# Patient Record
Sex: Male | Born: 1950
Health system: Southern US, Community
[De-identification: ages and names within clinical notes are randomized; demographics above are authoritative.]

## PROBLEM LIST (undated history)

## (undated) ENCOUNTER — Emergency Department (HOSPITAL_COMMUNITY): Admission: EM | Discharge status: Against Medical Advice | Payer: Self-pay

## (undated) DIAGNOSIS — M545 Low back pain, unspecified: Secondary | ICD-10-CM

## (undated) DIAGNOSIS — Z720 Tobacco use: Secondary | ICD-10-CM

## (undated) DIAGNOSIS — I255 Ischemic cardiomyopathy: Secondary | ICD-10-CM

## (undated) DIAGNOSIS — I10 Essential (primary) hypertension: Secondary | ICD-10-CM

## (undated) DIAGNOSIS — G8929 Other chronic pain: Secondary | ICD-10-CM

## (undated) DIAGNOSIS — I4891 Unspecified atrial fibrillation: Secondary | ICD-10-CM

## (undated) DIAGNOSIS — I5022 Chronic systolic (congestive) heart failure: Secondary | ICD-10-CM

## (undated) DIAGNOSIS — E785 Hyperlipidemia, unspecified: Secondary | ICD-10-CM

## (undated) DIAGNOSIS — F1010 Alcohol abuse, uncomplicated: Secondary | ICD-10-CM

## (undated) DIAGNOSIS — I2510 Atherosclerotic heart disease of native coronary artery without angina pectoris: Secondary | ICD-10-CM

## (undated) HISTORY — DX: Ischemic cardiomyopathy: I25.5

## (undated) HISTORY — DX: Hyperlipidemia, unspecified: E78.5

## (undated) HISTORY — DX: Alcohol abuse, uncomplicated: F10.10

## (undated) HISTORY — DX: Atherosclerotic heart disease of native coronary artery without angina pectoris: I25.10

## (undated) HISTORY — DX: Essential (primary) hypertension: I10

## (undated) HISTORY — DX: Unspecified atrial fibrillation: I48.91

## (undated) HISTORY — DX: Tobacco use: Z72.0

## (undated) HISTORY — PX: BACK SURGERY: SHX140

## (undated) HISTORY — DX: Chronic systolic (congestive) heart failure: I50.22

---

## 2011-02-27 ENCOUNTER — Other Ambulatory Visit: Payer: Self-pay

## 2011-02-27 ENCOUNTER — Emergency Department (HOSPITAL_COMMUNITY): Payer: Medicaid Other

## 2011-02-27 ENCOUNTER — Emergency Department (HOSPITAL_COMMUNITY)
Admission: EM | Admit: 2011-02-27 | Discharge: 2011-02-27 | Disposition: A | Discharge status: Other Acute Inpatient Hospital | Payer: Medicaid Other | Source: Home / Self Care | Attending: Emergency Medicine | Admitting: Emergency Medicine

## 2011-02-27 ENCOUNTER — Inpatient Hospital Stay (HOSPITAL_COMMUNITY)
Admission: EM | Admit: 2011-02-27 | Discharge: 2011-03-05 | DRG: 246 | Disposition: A | Discharge status: Home / Self Care | Payer: Medicaid Other | Source: Ambulatory Visit | Attending: Cardiovascular Disease | Admitting: Cardiovascular Disease

## 2011-02-27 ENCOUNTER — Encounter: Payer: Self-pay | Admitting: *Deleted

## 2011-02-27 DIAGNOSIS — F1020 Alcohol dependence, uncomplicated: Secondary | ICD-10-CM | POA: Diagnosis present

## 2011-02-27 DIAGNOSIS — F17200 Nicotine dependence, unspecified, uncomplicated: Secondary | ICD-10-CM | POA: Insufficient documentation

## 2011-02-27 DIAGNOSIS — I428 Other cardiomyopathies: Secondary | ICD-10-CM | POA: Diagnosis present

## 2011-02-27 DIAGNOSIS — I2119 ST elevation (STEMI) myocardial infarction involving other coronary artery of inferior wall: Secondary | ICD-10-CM | POA: Diagnosis present

## 2011-02-27 DIAGNOSIS — Z720 Tobacco use: Secondary | ICD-10-CM

## 2011-02-27 DIAGNOSIS — F10939 Alcohol use, unspecified with withdrawal, unspecified: Secondary | ICD-10-CM

## 2011-02-27 DIAGNOSIS — E871 Hypo-osmolality and hyponatremia: Secondary | ICD-10-CM | POA: Diagnosis not present

## 2011-02-27 DIAGNOSIS — I2510 Atherosclerotic heart disease of native coronary artery without angina pectoris: Secondary | ICD-10-CM

## 2011-02-27 DIAGNOSIS — I255 Ischemic cardiomyopathy: Secondary | ICD-10-CM

## 2011-02-27 DIAGNOSIS — I059 Rheumatic mitral valve disease, unspecified: Secondary | ICD-10-CM

## 2011-02-27 DIAGNOSIS — I4729 Other ventricular tachycardia: Secondary | ICD-10-CM | POA: Diagnosis not present

## 2011-02-27 DIAGNOSIS — R079 Chest pain, unspecified: Secondary | ICD-10-CM

## 2011-02-27 DIAGNOSIS — I4891 Unspecified atrial fibrillation: Secondary | ICD-10-CM | POA: Diagnosis present

## 2011-02-27 DIAGNOSIS — I5021 Acute systolic (congestive) heart failure: Secondary | ICD-10-CM | POA: Diagnosis not present

## 2011-02-27 DIAGNOSIS — I472 Ventricular tachycardia, unspecified: Secondary | ICD-10-CM | POA: Diagnosis not present

## 2011-02-27 DIAGNOSIS — K5900 Constipation, unspecified: Secondary | ICD-10-CM | POA: Diagnosis not present

## 2011-02-27 DIAGNOSIS — I2589 Other forms of chronic ischemic heart disease: Secondary | ICD-10-CM | POA: Diagnosis present

## 2011-02-27 DIAGNOSIS — F10239 Alcohol dependence with withdrawal, unspecified: Secondary | ICD-10-CM | POA: Diagnosis not present

## 2011-02-27 DIAGNOSIS — F1010 Alcohol abuse, uncomplicated: Secondary | ICD-10-CM | POA: Diagnosis present

## 2011-02-27 HISTORY — PX: CORONARY ANGIOPLASTY WITH STENT PLACEMENT: SHX49

## 2011-02-27 LAB — CBC
HCT: 50.7 % (ref 39.0–52.0)
HCT: 43.9 % (ref 39.0–52.0)
Hemoglobin: 17.3 g/dL — ABNORMAL HIGH (ref 13.0–17.0)
MCHC: 34.1 g/dL (ref 30.0–36.0)
MCHC: 33.9 g/dL (ref 30.0–36.0)
MCV: 95.2 fL (ref 78.0–100.0)
RDW: 14 % (ref 11.5–15.5)

## 2011-02-27 LAB — COMPREHENSIVE METABOLIC PANEL
Albumin: 3.2 g/dL — ABNORMAL LOW (ref 3.5–5.2)
BUN: 16 mg/dL (ref 6–23)
Creatinine, Ser: 0.87 mg/dL (ref 0.50–1.35)
Total Bilirubin: 0.7 mg/dL (ref 0.3–1.2)
Total Protein: 5.6 g/dL — ABNORMAL LOW (ref 6.0–8.3)

## 2011-02-27 LAB — BASIC METABOLIC PANEL
BUN: 15 mg/dL (ref 6–23)
CO2: 22 mEq/L (ref 19–32)
Chloride: 102 mEq/L (ref 96–112)
Creatinine, Ser: 1.13 mg/dL (ref 0.50–1.35)

## 2011-02-27 LAB — LIPID PANEL
HDL: 38 mg/dL — ABNORMAL LOW (ref >39)
Triglycerides: 71 mg/dL (ref <150)

## 2011-02-27 LAB — CARDIAC PANEL(CRET KIN+CKTOT+MB+TROPI)
CK, MB: 22 ng/mL (ref 0.3–4.0)
CK, MB: 408.4 ng/mL (ref 0.3–4.0)
Relative Index: 17.5 — ABNORMAL HIGH (ref 0.0–2.5)
Total CK: 207 U/L (ref 7–232)
Total CK: 2335 U/L — ABNORMAL HIGH (ref 7–232)
Troponin I: >25 ng/mL (ref <0.30)

## 2011-02-27 LAB — DIFFERENTIAL
Basophils Relative: 1 % (ref 0–1)
Lymphocytes Relative: 21 % (ref 12–46)
Monocytes Absolute: 0.7 10*3/uL (ref 0.1–1.0)
Monocytes Relative: 8 % (ref 3–12)
Neutro Abs: 6.4 10*3/uL (ref 1.7–7.7)

## 2011-02-27 LAB — MRSA PCR SCREENING: MRSA by PCR: NEGATIVE

## 2011-02-27 LAB — APTT: aPTT: 197 seconds — ABNORMAL HIGH (ref 24–37)

## 2011-02-27 LAB — POCT I-STAT, CHEM 8
Glucose, Bld: 91 mg/dL (ref 70–99)
HCT: 49 % (ref 39.0–52.0)
Hemoglobin: 16.7 g/dL (ref 13.0–17.0)
Potassium: 3.3 mEq/L — ABNORMAL LOW (ref 3.5–5.1)
TCO2: 12 mmol/L (ref 0–100)

## 2011-02-27 LAB — HEMOGLOBIN A1C
Hgb A1c MFr Bld: 5.5 % (ref <5.7)
Mean Plasma Glucose: 111 mg/dL (ref <117)

## 2011-02-27 LAB — PROTIME-INR: Prothrombin Time: 36.6 seconds — ABNORMAL HIGH (ref 11.6–15.2)

## 2011-02-27 LAB — POCT I-STAT TROPONIN I: Troponin i, poc: 0.08 ng/mL (ref 0.00–0.08)

## 2011-02-27 MED ORDER — HEPARIN (PORCINE) IN NACL 100-0.45 UNIT/ML-% IJ SOLN
1000.0000 [IU]/h | Freq: Once | INTRAMUSCULAR | Status: AC
  Administered 2011-02-27: 1000 [IU]/h via INTRAVENOUS

## 2011-02-27 MED ORDER — MORPHINE SULFATE 4 MG/ML IJ SOLN
INTRAMUSCULAR | Status: AC
  Filled 2011-02-27: qty 1

## 2011-02-27 MED ORDER — HEPARIN (PORCINE) IN NACL 100-0.45 UNIT/ML-% IJ SOLN
INTRAMUSCULAR | Status: AC
  Filled 2011-02-27: qty 250

## 2011-02-27 MED ORDER — NITROGLYCERIN IN D5W 200-5 MCG/ML-% IV SOLN
5.0000 ug/min | Freq: Once | INTRAVENOUS | Status: AC
  Administered 2011-02-27: 5 ug/min via INTRAVENOUS

## 2011-02-27 MED ORDER — METOPROLOL TARTRATE 1 MG/ML IV SOLN
5.0000 mg | Freq: Once | INTRAVENOUS | Status: AC
  Administered 2011-02-27: 5 mg via INTRAVENOUS
  Filled 2011-02-27: qty 5

## 2011-02-27 MED ORDER — NITROGLYCERIN IN D5W 200-5 MCG/ML-% IV SOLN
INTRAVENOUS | Status: AC
  Filled 2011-02-27: qty 250

## 2011-02-27 MED ORDER — ASPIRIN 81 MG PO CHEW
324.0000 mg | CHEWABLE_TABLET | Freq: Once | ORAL | Status: AC
  Administered 2011-02-27: 324 mg via ORAL

## 2011-02-27 MED ORDER — ASPIRIN 81 MG PO CHEW
CHEWABLE_TABLET | ORAL | Status: AC
  Administered 2011-02-27: 324 mg via ORAL
  Filled 2011-02-27: qty 4

## 2011-02-27 MED ORDER — MORPHINE SULFATE 4 MG/ML IJ SOLN
4.0000 mg | Freq: Once | INTRAMUSCULAR | Status: AC
  Administered 2011-02-27: 4 mg via INTRAVENOUS

## 2011-02-27 MED ORDER — HEPARIN BOLUS VIA INFUSION
4000.0000 [IU] | Freq: Once | INTRAVENOUS | Status: AC
  Administered 2011-02-27: 4000 [IU] via INTRAVENOUS
  Filled 2011-02-27: qty 4000

## 2011-02-27 NOTE — ED Notes (Signed)
[facility] called for CODE STEMI.  [facility] also called for transport.

## 2011-02-27 NOTE — ED Notes (Signed)
Spoke with cath lab - states Cardiologist is there and is requesting faxed copy of EKG.  Faxed by Diplomatic Services operational officer.

## 2011-02-27 NOTE — H&P (Signed)
NAME:  Andres Ward, PRETTY.:  [id number]  MEDICAL RECORD NO.:  [id number]  LOCATION:  2903                         FACILITY:  [facility]  PHYSICIAN:  Verne Carrow, MDDATE OF BIRTH:  1951-02-04  DATE OF ADMISSION:  02/27/2011 DATE OF DISCHARGE:                             HISTORY & PHYSICAL   CHIEF COMPLAINT:  Chest pain.  HISTORY OF PRESENT ILLNESS:  The patient is a 60 year old male with past medical history of tobacco abuse, who presented to the [facility] for evaluation of acute, sudden onset substernal chest pain with radiation to his back and in between his shoulder blades that began at approximately 8:15 a.m. on the morning of admission, when he was walking through the woods.  The pain was described as a burning and pressure-type pain that remained constant since onset with associated diaphoresis, shortness of breath, nausea.  The patient denied any specific aggravating factors; however, he did notably indicates mild improvement of pain with rest.  He notes that his chest pain was preceded by shortness of breath that awoke him from sleep at approximately 1:30 a.m. on the morning of admission.  Otherwise, the patient denied vomiting, recent cough, apparent chest pain, fevers, chills.  He denies known history of early myocardial infarction in family members.  In the [facility] ER, the patient was found to have an inferior wall ST-elevation MI.  Therefore, the patient was administered aspirin, morphine, oxygen supplementation, nitroglycerin with mild improvement of symptoms after nitroglycerin administration.  He was also loaded with heparin.  The patient was subsequently transferred to [facility] for emergent PCI via code STEMI protocol.  The patient continued to complain of substernal chest pain on presentation to [facility].  OUTPATIENT MEDICATIONS:  None.  ALLERGIES:  No known drug allergies.  PAST MEDICAL HISTORY:   Tobacco abuse, alcohol abuse, the patient has notably not seen a physician since approximately 1986.  PAST SURGICAL HISTORY:  Back surgery.  SOCIAL HISTORY:  One pack per day, tobacco abuse since teenage years (over 40-pack year smoking history), 3 shots of vodka daily since teenage years, denies drug abuse.  FAMILY HISTORY:  Father had 5 strokes, MI in his 41s.  Mother had an MI at age 1, hypertension.  REVIEW OF SYSTEMS:  As per HPI.  PHYSICAL EXAMINATION:  VITAL SIGNS:  Pulse 85, respirations 20, blood pressure 150/118, and O2 saturation 98% on 2 L nasal cannula. GENERAL:  No acute distress.  Alert and cooperative throughout exam, mildly diaphoretic. CVS:  Irregularly irregular rhythm, distant heart sounds. PULMONARY:  Clear to auscultation bilaterally, no wheezes, rales, or rhonchi. ABDOMEN:  Normoactive bowel sounds, soft, nontender, nondistended. EXTREMITIES:  Nonedematous, 2+ DP pulses bilaterally.  LABORATORY DATA:  Labs on admission:  At [facility] BMET:  Sodium 137, potassium 4, chloride 102, bicarb 22, BUN 15, creatinine 1.13, glucose 147. CBC:  WBC 9.2, hemoglobin 17.3, hematocrit 50.7, and platelets 239.  At ER, I-STAT labs as follows:  Sodium 105, potassium 3.3, chloride 75, bicarb 12, BUN 17, creatinine 0.4, glucose 91, hemoglobin 16.7, hematocrit 49. Imaging:  None.  EKG:  ST elevation in lead II, III, aVF.  ST depression in I, aVL, and V6.  Irregularly irregular rate and rhythm.  ASSESSMENT AND PLAN:  The patient is a 60 year old male with a past medical history of tobacco and alcohol abuse, who presented to [facility] with typical chest pain symptoms starting at 18:15 on the morning of admission, he was found to have an inferior wall STEMI and subsequently transferred to [facility] for emergent catheterization. 1. Inferior wall STEMI - the patient was loaded with heparin at [facility], treated with aspirin, morphine, and  oxygen     supplementation.  He will undergo emergent left heart     catheterization per Dr. Verne Carrow with post     catheterizations orders to follow.  We will pursue further risk     factor stratification and encourage smoking cessation. 2. Substance abuse, tobacco abuse - 1 pack per day for over 40 years -     we will recommend smoking cessation counseling, would consider     nicotine patch after acute arrhythmias have stabilized. 3. Alcohol abuse - 3 shots of vodka per day for over 40 years.  We     will request alcohol cessation counseling per Social Work.  Post catheterization, the patient will be transferred to the Cardiac ICU with continued management per [facility].    ______________________________ Johnette Abraham, DO   ______________________________ Verne Carrow, MD    SK/MEDQ  D:  02/27/2011  T:  02/27/2011  Job:  [id number]  cc:   Verne Carrow, MD  Electronically Signed by Johnette Abraham DO on 02/27/2011 03:00:01 PM Electronically Signed by Verne Carrow MD on 02/27/2011 03:25:32 PM

## 2011-02-27 NOTE — ED Notes (Signed)
Pt c/o cp. Pt states his chest began hurting this am at aprox 0100. Pt states pain radiates to left arm and jaw. Pt also c/o sob.

## 2011-02-27 NOTE — ED Provider Notes (Signed)
History   This chart was scribed for Donnetta Hutching, MD by Clarita Crane. The patient was seen in room APA02/APA02 and the patient's care was started at 9:46AM.   CSN: [id number] Arrival date & time: 02/27/2011  9:41 AM   First MD Initiated Contact with Patient 02/27/11 0944      Chief Complaint  Patient presents with  . Chest Pain   HPI level V caveat for urgent need for intervention and severe illness Daeshaun Specht is a 60 y.o. male who presents to the Emergency Department complaining of constant severe, non-radiating chest pain described as burning which radiates through to back between shoulders onset 8.5 hours ago which awoke him from his sleep and persistent since with associated SOB, diaphoresis and nausea. Patient states he was not administered aspirin prior to arrival. Patient is a current smoker and denies family h/o pre-mature cardiac related death. Reports he has not been evaluated by a physician in 30 years.  History reviewed. No pertinent past medical history.  Past Surgical History  Procedure Date  . Back surgery     History reviewed. No pertinent family history.  History  Substance Use Topics  . Smoking status: Current Everyday Smoker -- 1.0 packs/day  . Smokeless tobacco: Not on file  . Alcohol Use: Yes     daily      Review of Systems  Unable to perform ROS: Other   10 Systems reviewed and are negative for acute change except as noted in the HPI.  Allergies  Review of patient's allergies indicates no known allergies.  Home Medications  No current outpatient prescriptions on file.  There were no vitals taken for this visit.  Physical Exam  Nursing note and vitals reviewed. Constitutional: He is oriented to person, place, and time. He appears well-developed and well-nourished.  HENT:  Head: Normocephalic and atraumatic.  Eyes: EOM are normal. Pupils are equal, round, and reactive to light.  Neck: Neck supple. No tracheal deviation present.    Cardiovascular: Normal rate.  An irregularly irregular rhythm present.  Pulmonary/Chest: Effort normal. No respiratory distress.  Abdominal: He exhibits no distension.  Musculoskeletal: Normal range of motion. He exhibits no edema.  Neurological: He is alert and oriented to person, place, and time. No sensory deficit.  Skin: Skin is warm and dry.  Psychiatric: He has a normal mood and affect. His behavior is normal.    ED Course  Procedures (including critical care time)  DIAGNOSTIC STUDIES: Oxygen Saturation is 98% on nasal canula-2L, normal by my interpretation.    COORDINATION OF CARE: 9:48AM- Code STEMI initiated. Morphine, aspirin, nitroglycerin, heparin and a beta blocker were ordered in preparation for transfer to [facility].  9:53AM- Patient's friend informed patient is currently being treated for acute MI and will be transferred to [facility] for further treatment.    Labs Reviewed  CBC  DIFFERENTIAL  BASIC METABOLIC PANEL  I-STAT TROPONIN I   No results found.   No diagnosis found.  Date: 02/27/2011  Rate: 118  Rhythm: atrial fibrillation  QRS Axis: normal  Intervals: normal  ST/T Wave abnormalities: ST elevations inferiorly  Conduction Disutrbances:none  Narrative Interpretation:   Old EKG Reviewed: none available  ST depression laterally  MDM  History, physical exam, EKG consistent with inferior MI. IV morphine, beta blocker, aspirin, nitroglycerin, heparin given. Code STEMI called immediately. Cardiologist has not called me back. Patient will be sent to [facility] to catheter lab. I made a decision to transport was of utmost importance  cardiologist Nanetta Batty has not returned call. Patient needs to be moved to higher level care.  CRITICAL CARE Performed by: Donnetta Hutching   Total critical care time: 45  Critical care time was exclusive of separately billable procedures and treating other patients.  Critical care was necessary to treat or  prevent imminent or life-threatening deterioration.  Critical care was time spent personally by me on the following activities: development of treatment plan with patient and/or surrogate as well as nursing, discussions with consultants, evaluation of patient's response to treatment, examination of patient, obtaining history from patient or surrogate, ordering and performing treatments and interventions, ordering and review of laboratory studies, ordering and review of radiographic studies, pulse oximetry and re-evaluation of patient's condition.    I personally performed the services described in this documentation, which was scribed in my presence. The recorded information has been reviewed and considered.    Donnetta Hutching, MD 02/27/11 1014

## 2011-02-28 ENCOUNTER — Other Ambulatory Visit: Payer: Self-pay

## 2011-02-28 DIAGNOSIS — I2119 ST elevation (STEMI) myocardial infarction involving other coronary artery of inferior wall: Secondary | ICD-10-CM

## 2011-02-28 LAB — DIFFERENTIAL
Eosinophils Absolute: 0.1 10*3/uL (ref 0.0–0.7)
Eosinophils Relative: 1 % (ref 0–5)
Lymphocytes Relative: 24 % (ref 12–46)
Lymphs Abs: 1.8 10*3/uL (ref 0.7–4.0)
Monocytes Absolute: 0.7 10*3/uL (ref 0.1–1.0)
Monocytes Relative: 9 % (ref 3–12)

## 2011-02-28 LAB — LIPID PANEL
LDL Cholesterol: 108 mg/dL — ABNORMAL HIGH (ref 0–99)
Triglycerides: 105 mg/dL (ref <150)

## 2011-02-28 LAB — CBC
HCT: 42.3 % (ref 39.0–52.0)
MCH: 33.1 pg (ref 26.0–34.0)
MCHC: 34.5 g/dL (ref 30.0–36.0)
MCV: 95.9 fL (ref 78.0–100.0)
Platelets: 179 10*3/uL (ref 150–400)
RDW: 14.1 % (ref 11.5–15.5)

## 2011-02-28 LAB — BASIC METABOLIC PANEL
BUN: 20 mg/dL (ref 6–23)
CO2: 20 mEq/L (ref 19–32)
Calcium: 8.1 mg/dL — ABNORMAL LOW (ref 8.4–10.5)
GFR calc non Af Amer: >90 mL/min (ref >90)
Glucose, Bld: 109 mg/dL — ABNORMAL HIGH (ref 70–99)

## 2011-02-28 LAB — HEMOGLOBIN A1C
Hgb A1c MFr Bld: 5.5 % (ref <5.7)
Mean Plasma Glucose: 111 mg/dL (ref <117)

## 2011-02-28 LAB — CARDIAC PANEL(CRET KIN+CKTOT+MB+TROPI)
Relative Index: 16.1 — ABNORMAL HIGH (ref 0.0–2.5)
Total CK: 2510 U/L — ABNORMAL HIGH (ref 7–232)

## 2011-02-28 LAB — TSH: TSH: 2.315 u[IU]/mL (ref 0.350–4.500)

## 2011-02-28 MED ORDER — METOPROLOL TARTRATE 25 MG PO TABS: 25.0000 mg | ORAL_TABLET | Freq: Three times a day (TID) | ORAL | Status: DC

## 2011-02-28 MED ORDER — LORAZEPAM 1 MG PO TABS
1.0000 mg | ORAL_TABLET | Freq: Four times a day (QID) | ORAL | Status: AC | PRN
  Administered 2011-03-02: 1 mg via ORAL
  Filled 2011-02-28: qty 1

## 2011-02-28 MED ORDER — VITAMIN B-1 100 MG PO TABS
100.0000 mg | ORAL_TABLET | Freq: Every day | ORAL | Status: DC
  Administered 2011-03-01 – 2011-03-05 (×6): 100 mg via ORAL
  Filled 2011-02-28 (×6): qty 1

## 2011-02-28 MED ORDER — OXYCODONE-ACETAMINOPHEN 5-325 MG PO TABS: 1.0000 | ORAL_TABLET | ORAL | Status: DC | PRN

## 2011-02-28 MED ORDER — THERA M PLUS PO TABS
1.0000 | ORAL_TABLET | Freq: Every day | ORAL | Status: DC
  Administered 2011-03-01 – 2011-03-05 (×5): 1 via ORAL
  Filled 2011-02-28 (×6): qty 1

## 2011-02-28 MED ORDER — ROSUVASTATIN CALCIUM 40 MG PO TABS
40.0000 mg | ORAL_TABLET | Freq: Every day | ORAL | Status: DC
  Administered 2011-03-01 – 2011-03-04 (×4): 40 mg via ORAL
  Filled 2011-02-28 (×7): qty 1

## 2011-02-28 MED ORDER — FOLIC ACID 1 MG PO TABS
1.0000 mg | ORAL_TABLET | Freq: Every day | ORAL | Status: DC
  Administered 2011-03-01 – 2011-03-05 (×6): 1 mg via ORAL
  Filled 2011-02-28 (×6): qty 1

## 2011-02-28 MED ORDER — LORAZEPAM 2 MG/ML IJ SOLN: 1.0000 mg | Freq: Four times a day (QID) | INTRAMUSCULAR | Status: AC | PRN

## 2011-02-28 MED ORDER — FUROSEMIDE 10 MG/ML IJ SOLN: 40.0000 mg | Freq: Every day | INTRAMUSCULAR | Status: DC

## 2011-02-28 MED ORDER — RAMIPRIL 2.5 MG PO CAPS
2.5000 mg | ORAL_CAPSULE | Freq: Every day | ORAL | Status: DC
  Filled 2011-02-28 (×2): qty 1

## 2011-02-28 MED ORDER — PRASUGREL HCL 10 MG PO TABS
10.0000 mg | ORAL_TABLET | Freq: Every day | ORAL | Status: DC
  Administered 2011-03-01 – 2011-03-05 (×6): 10 mg via ORAL
  Filled 2011-02-28 (×4): qty 1

## 2011-02-28 MED ORDER — NITROGLYCERIN 0.4 MG SL SUBL
0.4000 mg | SUBLINGUAL_TABLET | SUBLINGUAL | Status: DC | PRN
Start: 2011-02-28 — End: 2011-03-05

## 2011-02-28 MED ORDER — ACETAMINOPHEN 325 MG PO TABS
650.0000 mg | ORAL_TABLET | ORAL | Status: DC | PRN
  Administered 2011-03-04: 650 mg via ORAL

## 2011-02-28 MED ORDER — LORAZEPAM 2 MG/ML IJ SOLN
0.0000 mg | Freq: Two times a day (BID) | INTRAMUSCULAR | Status: AC
  Administered 2011-03-01 – 2011-03-02 (×2): 2 mg via INTRAVENOUS

## 2011-02-28 MED ORDER — DEXTROSE 5 % IV SOLN
0.5000 mg/min | INTRAVENOUS | Status: DC
  Filled 2011-02-28: qty 9

## 2011-02-28 MED ORDER — SODIUM CHLORIDE 0.9 % IJ SOLN
3.0000 mL | Freq: Two times a day (BID) | INTRAMUSCULAR | Status: DC
  Filled 2011-02-28 (×4): qty 3

## 2011-02-28 MED ORDER — MORPHINE SULFATE 2 MG/ML IJ SOLN: 2.0000 mg | INTRAMUSCULAR | Status: DC | PRN

## 2011-02-28 MED ORDER — LORAZEPAM 2 MG/ML IJ SOLN
0.0000 mg | Freq: Four times a day (QID) | INTRAMUSCULAR | Status: AC
  Administered 2011-03-01 (×2): 2 mg via INTRAVENOUS

## 2011-02-28 MED ORDER — ASPIRIN EC 81 MG PO TBEC
81.0000 mg | DELAYED_RELEASE_TABLET | Freq: Every day | ORAL | Status: DC
  Administered 2011-03-01 – 2011-03-05 (×6): 81 mg via ORAL
  Filled 2011-02-28 (×6): qty 1

## 2011-02-28 MED ORDER — BIOTENE DRY MOUTH MT LIQD
15.0000 mL | Freq: Two times a day (BID) | OROMUCOSAL | Status: DC
  Administered 2011-03-01 – 2011-03-05 (×6): 15 mL via OROMUCOSAL

## 2011-03-01 DIAGNOSIS — I255 Ischemic cardiomyopathy: Secondary | ICD-10-CM | POA: Diagnosis present

## 2011-03-01 DIAGNOSIS — I4891 Unspecified atrial fibrillation: Secondary | ICD-10-CM | POA: Diagnosis present

## 2011-03-01 DIAGNOSIS — F10239 Alcohol dependence with withdrawal, unspecified: Secondary | ICD-10-CM | POA: Diagnosis not present

## 2011-03-01 DIAGNOSIS — I2510 Atherosclerotic heart disease of native coronary artery without angina pectoris: Secondary | ICD-10-CM | POA: Diagnosis present

## 2011-03-01 LAB — BASIC METABOLIC PANEL
BUN: 25 mg/dL — ABNORMAL HIGH (ref 6–23)
Chloride: 105 mEq/L (ref 96–112)
Creatinine, Ser: 1.06 mg/dL (ref 0.50–1.35)
GFR calc Af Amer: 88 mL/min — ABNORMAL LOW (ref >90)
GFR calc non Af Amer: 76 mL/min — ABNORMAL LOW (ref >90)
Glucose, Bld: 92 mg/dL (ref 70–99)

## 2011-03-01 MED ORDER — FUROSEMIDE 10 MG/ML IJ SOLN
40.0000 mg | Freq: Every day | INTRAMUSCULAR | Status: DC
  Filled 2011-03-01: qty 4

## 2011-03-01 MED ORDER — ENOXAPARIN SODIUM 40 MG/0.4ML ~~LOC~~ SOLN
40.0000 mg | SUBCUTANEOUS | Status: DC
  Administered 2011-03-01: 40 mg via SUBCUTANEOUS
  Filled 2011-03-01 (×2): qty 0.4

## 2011-03-01 MED ORDER — RAMIPRIL 5 MG PO CAPS
5.0000 mg | ORAL_CAPSULE | Freq: Every day | ORAL | Status: DC
  Administered 2011-03-01 – 2011-03-02 (×2): 5 mg via ORAL
  Filled 2011-03-01 (×2): qty 1

## 2011-03-01 MED ORDER — METOPROLOL TARTRATE 50 MG PO TABS
50.0000 mg | ORAL_TABLET | Freq: Two times a day (BID) | ORAL | Status: DC
  Administered 2011-03-01 (×2): 50 mg via ORAL
  Filled 2011-03-01 (×4): qty 1

## 2011-03-01 MED ORDER — SODIUM CHLORIDE 0.9 % IV SOLN
INTRAVENOUS | Status: DC
  Administered 2011-03-02: 10:00:00 via INTRAVENOUS

## 2011-03-01 MED ORDER — SODIUM CHLORIDE 0.9 % IV SOLN
INTRAVENOUS | Status: DC
  Administered 2011-03-01: 10 mL via INTRAVENOUS

## 2011-03-01 MED ORDER — NITROGLYCERIN IN D5W 200-5 MCG/ML-% IV SOLN: 2.0000 ug/min | INTRAVENOUS | Status: DC

## 2011-03-01 NOTE — Plan of Care (Signed)
Problem: Consults Goal: Tobacco Cessation referral if indicated Outcome: Not Progressing CIWA protocol  Problem: Phase II Progression Outcomes Goal: Wean IV nitroglycerin to PO or topical Outcome: Progressing NTG to be d/c'd today

## 2011-03-01 NOTE — Progress Notes (Signed)
SUBJECTIVE:Pt has no complaints this am. He denies SOB/CP. No events overnight. Remains in atrial fibrillation with heart rates 100-110. Pt is on the CWOL protocol.   Filed Vitals:   03/01/11 0400 03/01/11 0500 03/01/11 0600 03/01/11 0700  BP: 130/83 108/79 110/89 133/117  Pulse:      Height:      Weight:  199 lb 11.8 oz (90.6 kg)    SpO2: 92% 95% 96% 97%    Intake/Output Summary (Last 24 hours) at 03/01/11 0755 Last data filed at 03/01/11 7829  Gross per 24 hour  Intake      0 ml  Output    500 ml  Net   -500 ml    PHYSICAL EXAM General: Well developed, well nourished, in no acute distress Lungs:  Faint crackles at the bases. No wheezes, rhonci.  Heart: Irregular, Soft systolic murmur.  Abdomen: Bowel sounds are positive, abdomen soft and non-tender without masses or                  Hernia's noted. Extremities: No edema. All ext warm to touch.  Neuro: Alert and oriented X 3.   LABS: Basic Metabolic Panel:  Basename 02/28/11 0156 02/27/11 1150  NA 137 138  K 4.7 4.2  CL 106 109  CO2 20 17*  GLUCOSE 109* 141*  BUN 20 16  CREATININE 0.94 0.87  CALCIUM 8.1* 7.5*  MG 1.8 --  PHOS -- --   CBC:  Basename 02/28/11 0156 02/27/11 1150 02/27/11 0956  WBC 7.5 9.6 --  NEUTROABS 5.0 -- 6.4  HGB 14.6 14.9 --  HCT 42.3 43.9 --  MCV 95.9 95.2 --  PLT 179 198 --   Cardiac Enzymes:  Basename 02/28/11 0148 02/27/11 2027 02/27/11 1150  CKTOTAL 2510* 2335* 207  CKMB 403.2* 408.4* 22.0*  CKMBINDEX -- -- --  TROPONINI >25.00* >25.00* 2.31*   Fasting Lipid Panel:  Basename 02/28/11 0156  CHOL 166  HDL 37*  LDLCALC 108*  TRIG 105  CHOLHDL 4.5  LDLDIRECT --     ASSESSMENT AND PLAN:  Active Problems:  1. CAD (coronary artery disease): Stable. He is s/p a drug eluting stent in totally occluded RCA on 02/28/11. He has moderate to severe residual disease in the Circumflex and RCA. Will continue ASA, Effient, statin, beta blocker. Plan staged intervention of the RCA  and Circumflex at a later date. D/C NTG drip.   2. Ischemic cardiomyopathy: Biventricular failure with LVEF of 15%. His LV and RV are dilated. This is likely a mixed cardiomyopathy with history of alcohol abuse and severe CAD. The degree of biventricular failure seems out of proportion to his level of CAD though. Suspect alcohol has played a large part in this. Will increase his Lisinopril and beta blocker. Follow up BMET in the am.   3. Atrial fibrillation: Rate controlled at this time. Amiodarone had been started for VT in the cath lab. Amiodarone has been stopped. Will increase beta blocker. No heparin at this time as patient has been agitated and pulling out IV's. Will start heparin drip when patient is cooperative.   4. Alcohol withdrawal: He is on the withdrawal protocol. Will monitor in ICU today.      ,  11/4/20127:55 AM

## 2011-03-02 LAB — BASIC METABOLIC PANEL
BUN: 22 mg/dL (ref 6–23)
Chloride: 101 mEq/L (ref 96–112)
Creatinine, Ser: 1.09 mg/dL (ref 0.50–1.35)
GFR calc Af Amer: 85 mL/min — ABNORMAL LOW (ref >90)
Glucose, Bld: 96 mg/dL (ref 70–99)

## 2011-03-02 LAB — CBC
HCT: 42.6 % (ref 39.0–52.0)
Hemoglobin: 13.9 g/dL (ref 13.0–17.0)
MCH: 31.5 pg (ref 26.0–34.0)
MCHC: 32.6 g/dL (ref 30.0–36.0)
MCV: 96.6 fL (ref 78.0–100.0)
RDW: 13.8 % (ref 11.5–15.5)

## 2011-03-02 MED ORDER — NICOTINE 14 MG/24HR TD PT24
14.0000 mg | MEDICATED_PATCH | Freq: Every day | TRANSDERMAL | Status: DC
  Administered 2011-03-02: 14 mg via TRANSDERMAL
  Filled 2011-03-02 (×2): qty 1

## 2011-03-02 MED ORDER — HEPARIN (PORCINE) IN NACL 100-0.45 UNIT/ML-% IJ SOLN
1950.0000 [IU]/h | INTRAMUSCULAR | Status: DC
  Administered 2011-03-03: 1650 [IU]/h via INTRAVENOUS
  Filled 2011-03-02: qty 250

## 2011-03-02 MED ORDER — HEPARIN BOLUS VIA INFUSION
2000.0000 [IU] | Freq: Once | INTRAVENOUS | Status: AC
  Administered 2011-03-02: 2000 [IU] via INTRAVENOUS

## 2011-03-02 MED ORDER — FUROSEMIDE 40 MG PO TABS
40.0000 mg | ORAL_TABLET | Freq: Every day | ORAL | Status: DC
  Administered 2011-03-02 – 2011-03-05 (×4): 40 mg via ORAL
  Filled 2011-03-02 (×4): qty 1

## 2011-03-02 MED ORDER — RAMIPRIL 10 MG PO CAPS
10.0000 mg | ORAL_CAPSULE | Freq: Every day | ORAL | Status: DC
  Administered 2011-03-03 – 2011-03-05 (×3): 10 mg via ORAL
  Filled 2011-03-02 (×3): qty 1

## 2011-03-02 MED ORDER — HEPARIN BOLUS VIA INFUSION
2500.0000 [IU] | Freq: Once | INTRAVENOUS | Status: AC
  Administered 2011-03-02: 2500 [IU] via INTRAVENOUS
  Filled 2011-03-02: qty 2500

## 2011-03-02 MED ORDER — HEPARIN (PORCINE) IN NACL 100-0.45 UNIT/ML-% IJ SOLN
1350.0000 [IU]/h | INTRAMUSCULAR | Status: DC
  Administered 2011-03-02: 1350 [IU]/h via INTRAVENOUS
  Filled 2011-03-02 (×2): qty 250

## 2011-03-02 MED ORDER — METOPROLOL TARTRATE 50 MG PO TABS
75.0000 mg | ORAL_TABLET | Freq: Two times a day (BID) | ORAL | Status: DC
  Administered 2011-03-02 (×2): 75 mg via ORAL
  Filled 2011-03-02 (×4): qty 1

## 2011-03-02 MED ORDER — HYDRALAZINE HCL 25 MG PO TABS
25.0000 mg | ORAL_TABLET | Freq: Three times a day (TID) | ORAL | Status: DC
  Administered 2011-03-02 – 2011-03-05 (×9): 25 mg via ORAL
  Filled 2011-03-02 (×12): qty 1

## 2011-03-02 NOTE — Progress Notes (Signed)
Dr. Sanjuana Kava  Notified of pt's c/o shortness of breath.  Condition report given. Pt's bp 138/112.  Continues in atrial fibrillation. O2 placed at 2 L n/c.  Pt appears slightly anxious.  Orders noted - will administer new orders.

## 2011-03-02 NOTE — Progress Notes (Signed)
ANTICOAGULATION CONSULT NOTE - Initial Consult  Pharmacy Consult for Heparin  Indication: chest pain/ACS  No Known Allergies  Patient Measurements: Height: 6' (182.9 cm) Weight: 199 lb 11.8 oz (90.6 kg) (bed) IBW/kg (Calculated) : 77.6      Vital Signs: Temp: 98.3 F (36.8 C) (11/05 0000) Temp src: Oral (11/05 0000) BP: 103/78 mmHg (11/05 0500) Pulse Rate: 100  (11/05 0400)  Labs:  Basename 03/02/11 0533 03/01/11 0650 02/28/11 0156 02/28/11 0148 02/27/11 2027 02/27/11 1150  HGB 13.9 -- 14.6 -- -- --  HCT 42.6 -- 42.3 -- -- 43.9  PLT 161 -- 179 -- -- 198  APTT -- -- -- -- -- 197*  LABPROT -- -- 16.5* -- -- 36.6*  INR -- -- 1.31 -- -- 3.62*  HEPARINUNFRC -- -- -- -- -- --  CREATININE 1.09 1.06 0.94 -- -- --  CKTOTAL -- -- -- 2510* 2335* 207  CKMB -- -- -- 403.2* 408.4* 22.0*  TROPONINI -- -- -- >25.00* >25.00* 2.31*   Estimated Creatinine Clearance: 81.1 ml/min (by C-G formula based on Cr of 1.09).  Medical History: No past medical history on file.  Tobacco and Alcohol use   Medications:  Prescriptions prior to admission  Medication Sig Dispense Refill  . ibuprofen (ADVIL,MOTRIN) 200 MG tablet Take 600 mg by mouth every 6 (six) hours as needed. For pain         Assessment: 60 yo Man admitted 11/2 with at STEMI s/p DES pending staged PCI, no ongoing chest pain but to start heparin pending return to the cath lab.  Goal of Therapy:   Heparin level 0.3-0.7 units/ml   Plan:  2000 units bolus followed by and infusion rate of 1350 units/hr  [employer] 03/02/2011,7:54 AM

## 2011-03-02 NOTE — Cardiovascular Report (Signed)
NAME:  Andres Ward, Andres Ward NO.:  [id number]  MEDICAL RECORD NO.:  [id number]  LOCATION:  APA02                         FACILITY:  [facility]  PHYSICIAN:  Verne Carrow, MDDATE OF BIRTH:  11/01/1950  DATE OF PROCEDURE:  02/27/2011 DATE OF DISCHARGE:  02/27/2011                           CARDIAC CATHETERIZATION   PROCEDURE PERFORMED: 1. Left heart cath. 2. Selective coronary angiography. 3. Left ventricular angiogram. 4. PTCA with placement of drug-eluting stent in the proximal right     coronary artery.  OPERATOR:  Verne Carrow, MD.  INDICATION:  This is a 60 year old Caucasian male with history of tobacco abuse, who presented to [facility] Emergency Department this morning with complaints of shortness of breath and chest pain. Apparently, the patient began having shortness of breath around 1 a.m. When he woke up this morning, he had severe chest pain, presented to the emergency department.  His initial EKG showed ST-segment elevation in the inferior leads consistent with an acute inferior ST elevation myocardial infarction.  A code STEMI was activated.  The patient was transported emergently to [facility] directly to the cath lab for the planned emergent cardiac catheterizations.  DETAILS OF PROCEDURE:  When the patient arrived in the cath lab, he was still having complaints of chest pain and jaw pain.  The patient's blood pressure was approximately 90/50 on arrival to the cath lab.  The patient arrived in the cath lab at 10:44 a.m.  The patient was quickly moved to the cath table in supine position.  The right groin was prepped and draped in sterile fashion.  Emergency consent was obtained.  1% lidocaine was used for local anesthesia.  A 6-French sheath was inserted into the right femoral artery without difficulty.  We initially performed angiography of the left coronary system with a JL-4 catheter. We then selectively engaged the right  coronary artery with a 6-French JR4 guiding catheter.  The patient was given a bolus of Angiomax and drip was started.  The patient was given 60 mg of Effient.  We then used a cougar wire to passed down the right coronary artery.  Some flow was reestablished down the vessel after the wire was passed through the total occlusion.  A 2.5 x 15 mm balloon was used to predilate the lesion.  Excellent flow was reestablished into the vessel.  Next, picture actually showed that there was no flow into the distal vessel. Once again, I took the 2.5 x 15 mm balloon down and inflated this in the area of tightest stenosis.  A 3.0 x 16 mm PROMUS element drug-eluting stent was carefully positioned in the midvessel and deployed without difficulty.  A 3.25 x 12 mm noncompliant balloon was inflated twice inside the stented segment.  The stenosis was taken from 100% down to 0%.  There was excellent flow into the distal vessel.  After the intervention, we did discover the downstream, there is also severe disease that will need to be treated in the future.  During the case, the patient became hypotensive and required dopamine intravenous infusion.  The patient is in atrial fibrillation at time of arrival to [facility] and continued to be in atrial fibrillation during the  case.  The patient began to have multiple runs of a wide complex tachycardia after reperfusion.  He was given 150 mg bolus of amiodarone x2.  We then started an amiodarone drip.  The patient had the dopamine weaned here in the cath lab.  He exited the cath lab with a blood pressure approximately 90/60.  The patient had no complaints of chest pain at the conclusion of the case.  He was taken to the CCU in stable, yet guarded position.  The sheath was left in the right femoral artery at the conclusion of the case.  HEMODYNAMIC FINDINGS:  Central aortic pressure 145/95.  Left ventricular pressure 112/15 over 25.  ANGIOGRAPHIC  FINDINGS: 1. The left main coronary artery had no evidence of disease. 2. The left anterior descending artery was a large vessel that coursed     to the apex and gave off a moderate-sized diagonal branch.  The     ostium of this branch had approximately 50% stenosis.  The LAD     itself had mild plaque disease throughout its mid and distal     portion, but no focally obstructive lesions. 3. Circumflex artery is a large caliber vessel that has a 70% proximal     stenosis and then gives off a large bifurcating obtuse marginal     branch which has a 40% stenosis.  The mid vessel just after the     takeoff of the first obtuse marginal branch has a 80% stenosis.     The vessel then gives off a second obtuse marginal branch which has     a 40% ostial stenosis.  The distal vessel just beyond the takeoff     of the second obtuse marginal branch has 99% stenosis and there is     approximately a 2 mm vessel in this location. 4. The right coronary artery had a 100% proximal occlusion. 5. Left ventricular angiogram was performed in the RAO projection with     a hand injection only given the patient's elevated diastolic     filling pressures.  Ejection fraction was approximately 35%.IMPRESSION: 1. Acute inferior ST elevation myocardial infarction secondary to     occluded proximal right coronary artery.  The patient is now status     post drug-eluting stent x1 in the mid right coronary artery. 2. Moderate to severe residual disease in the right coronary artery     and circumflex artery. 3. Moderate left ventricular systolic dysfunction.  RECOMMENDATIONS:  The patient will be watched closely in the CCU.  We will continue aspirin, Effient, and a statin.  We will not give a beta- blocker today because of his hypotension.  We will check an echocardiogram later today.  We will continue the infusion of amiodarone given the patient's multiple runs of wide complex tachycardia consistent with ventricular  tachycardia.  The patient will be started on an ACE inhibitor at a later time as he tolerates.     Verne Carrow, MD     CM/MEDQ  D:  02/27/2011  T:  02/28/2011  Job:  [id number]  Electronically Signed by Verne Carrow MD on 03/02/2011 09:43:54 AM

## 2011-03-02 NOTE — Progress Notes (Signed)
Care Manager, Tomi Bamberger, RN,BSN CM spoke to pt and he does not have any insurance. CM called [facility] and left vm for assistance with hospital bill and questionable disability vs medicaid. When pt is d/c please write all generic medications due to cost and a script for 30 day free effient no refills and original script with refills. CM will provide pt with an effient card. [employer] forms need to be completed for patient assistance-will be in the rm with pt. CM did make pt an appointment at the [facility]. Health dept. 03-17-11 @ 1315. Pt will speak to Meriam Sprague at [facility] to discuss getting help with medications. CM will continue to f/u for additional assistance.

## 2011-03-02 NOTE — Progress Notes (Signed)
Csw department (13:45pm) CSW was unable to do assessment today. Patient was asleep and RN requested CSW to come again tomorrow due to the patients blood pressure being too high.CSW will return tomorrow to complete assessment.  [phone]

## 2011-03-02 NOTE — Progress Notes (Signed)
CARDIAC REHAB PHASE I   PRE:  Rate/Rhythm: 106 afic BP:  Supine:   Sitting: L 145/114 R 147/102  Standing:    SaO2:   MODE:  Ambulation: 430 ft   POST:  Rate/Rhythem: 135 afib  BP:  Supine:   Sitting: 144/101  Standing:    SaO2:    Pt ambulated unit with minimal x1 assist slight unsteady/wobbly. Denies cp or sob. Tolerated well. Pt to chair with feet up. Pt education started for tobacco and alcohol - pt motivated to quit smoking, reluctant to quit drinking. Will follow up again.    Rosalie Doctor

## 2011-03-02 NOTE — Progress Notes (Signed)
CRITICAL VALUE ALERT  Critical value received: co2   Date of notification:  03/02/2011  Time of notification:  0650  Critical value read back:yes  Nurse who received alert:  Gaspar Garbe RN  MD notified (1st page):  Dr. Saralyn Pilar  Time of first page:  0650  MD notified (2nd page):  Time of second page:  Responding MD:  Saralyn Pilar  Time MD responded:  [phone]

## 2011-03-02 NOTE — Progress Notes (Signed)
[place] CARDIOLOGY - PGY II RESIDENT NOTE   Subjective:    No events overnight. Patient feeling well this morning without symptoms of shortness of breath, chest pain, palpitations. States that he feels a lot better than on admission, and even as compared to yesterday.   Objective:  Vital Signs:   BP 116/63  Pulse 100  Temp(Src) 98.3 F (36.8 C) (Oral)  Ht 6' (1.829 m)  Wt 199 lb 11.8 oz (90.6 kg)  BMI 27.09 kg/m2  SpO2 98%   Intake/Output:  11/04 0701 - 11/05 0700 In: 700 [P.O.:540; I.V.:160] Out: 1500 [Urine:1500]   Physical Exam: General: Vital signs reviewed and noted. Well-developed, well-nourished, in no acute distress; alert, appropriate and cooperative throughout examination.  Head: Normocephalic, atraumatic.  Lungs:  Normal respiratory effort. Clear to auscultation BL except mild bibasilar crackles.   Heart: Regular rate, irregular rhythm. S1 and S2 normal without gallop, murmur, or rubs.  Abdomen:  BS normoactive. Soft, Nondistended, non-tender.  No masses or organomegaly.  Extremities: No pretibial edema.     Labs: Basic Metabolic Panel:  Lab 03/02/11 6295 03/01/11 0650 02/28/11 0156  NA 136 137 137  K 4.2 4.3 --  CL 101 105 106  CO2 25 24 20   GLUCOSE 96 92 109*  BUN 22 25* 20  CREATININE 1.09 1.06 0.94  CALCIUM 8.8 8.4 8.1*  MG -- 1.8 1.8  PHOS -- -- --    CBC: Lab 03/02/11 0533 02/28/11 0156  WBC 7.4 7.5  NEUTROABS -- 5.0  HGB 13.9 14.6  HCT 42.6 42.3  MCV 96.6 95.9  PLT 161 179    Cardiac Enzymes:  Lab 02/28/11 0148 02/27/11 2027 02/27/11 1150  CKTOTAL 2510* 2335* 207  CKMB 403.2* 408.4* 22.0*  CKMBINDEX -- -- --  TROPONINI >25.00* >25.00* 2.31*    Microbiology: Results for orders placed during the hospital encounter of 02/27/11  MRSA PCR SCREENING     Status: Normal   Collection Time   02/27/11 12:46 PM      Component Value Range Status Comment   MRSA by PCR NEGATIVE  NEGATIVE  Final     Imaging: No new imaging  studies.   Medications: Infusions: . sodium chloride 20 mL/hr at 03/01/11 1400    Scheduled Medications: . antiseptic oral rinse  15 mL Mouth Rinse BID  . aspirin EC  81 mg Oral Daily  . enoxaparin (LOVENOX) injection  40 mg Subcutaneous Q24H  . folic acid  1 mg Oral Daily  . furosemide  40 mg Intravenous Daily  . LORazepam  0-4 mg Intravenous Q6H   Followed by  . LORazepam  0-4 mg Intravenous Q12H  . metoprolol tartrate  50 mg Oral BID  . multivitamins ther. w/minerals  1 tablet Oral Daily  . prasugrel  10 mg Oral Daily  . ramipril  5 mg Oral Daily  . rosuvastatin  40 mg Oral QHS  . vitamin B-1  100 mg Oral Daily    PRN Medications: acetaminophen, LORazepam, LORazepam, morphine injection, nitroGLYCERIN, oxyCODONE-acetaminophen   Assessment/ Plan: Pt is a 60 y.o. yo male who was admitted on 02/27/2011 with symptoms of chest pain, which was determined to be secondary to STEMI, for which he went emergently to cath lab with DES placed in his RCA on November 20, 202012. Interventions at this time will be focused on stabilization of pt's alcohol withdrawal symptoms to allow for possible recath at a later date with consideration for intervention of remaining moderate to severe lesions.  CAD (coronary artery disease): Stable. He is s/p a drug eluting stent in totally occluded RCA on 02/28/11. He has moderate to severe residual disease in the Circumflex and RCA.   Will continue ASA, Effient, statin, beta blocker.   Plan staged intervention of the RCA and Circumflex at a later date.   Ischemic cardiomyopathy: Biventricular failure with LVEF of 15%. His LV and RV are dilated. This is likely a mixed cardiomyopathy with history of alcohol abuse and severe CAD. The degree of biventricular failure seems out of proportion to his level of CAD though. Suspect alcohol has played a large part in this.   Both altace and metoprolol were increased on 03/01/11.  Will continue at current dosages of beta  blocker and ACE inhibitor at this time, with consideration of increasing dosages at 1-2 week intervals with goal ramipril of approximately 10 mg per day as tolerated by his blood pressures.  If patient is not expected to go to cath during this admission, can consider transition his IV lasix to oral.    Atrial fibrillation: Rate controlled at this time with increased of beta blocker yesterday. Has not required restarting of amiodarone that was first started in the catheter lab. Consideration for resuming heparin when the patient is cooperative and not acutely having alcohol withdrawal symptoms.  Continue current beta blocker of metoprolol 50 mg by mouth twice a day, with consideration for escalation to 75mg  BID.  Consider to start heparin drip when patient is cooperative.    Alcohol withdrawal:   Continue CIWA protocol.   Continue encourage alcohol cessation, as future anticoagulation will be of concern if he continues with alcohol abuse.      KALIA-REYNOLDS,M SHELLY 03/02/2011, 6:34 AM   I have seen and examined this patient with Johnette Abraham, DO. I agree with the assessment and plan as outlined. We will increase his beta blocker, start heparin for atrial fib, transfer to Step down ICU.

## 2011-03-02 NOTE — Consult Note (Signed)
Tobacco Cessation- Pt smokes 1ppd+ and is in action stage and motivated to quit. Pt's wife in the room with him and she also smokes over a ppd. She and the pt are both eager to quit. They both are going to quit on their own without any medical aids at this time. Emphasized to both pt and wife that cold [place] is not either the only way or the best way necessarily to quit but that they can use different medical aid options available. If the need should arise. Briefly reviewed NRT and med aid options available so if they are not successful on their own they could try these alternatives. Referred to 1-800-quit-now for f/u and support. Discussed oral fixation substitutes, 2nd hand smoke risks and in-home smoking policies. Reviewed and gave pt written education/contact information.

## 2011-03-02 NOTE — Progress Notes (Signed)
ANTICOAGULATION CONSULT NOTE - Follow Up Consult  Pharmacy Consult for Heparin Indication: atrial fibrillation  No Known Allergies  Patient Measurements: Height: 6' (182.9 cm) Weight: 199 lb 11.8 oz (90.6 kg) (bed) IBW/kg (Calculated) : 77.6  Adjusted Body Weight: 90.6 kg  Vital Signs: Temp: 98.3 F (36.8 C) (11/05 1600) Temp src: Oral (11/05 1600) BP: 134/84 mmHg (11/05 1600) Pulse Rate: 84  (11/05 1600)  Labs:  Basename 03/02/11 1529 03/02/11 0533 03/01/11 0650 02/28/11 0156 02/28/11 0148 02/27/11 2027  HGB -- 13.9 -- 14.6 -- --  HCT -- 42.6 -- 42.3 -- --  PLT -- 161 -- 179 -- --  APTT -- -- -- -- -- --  LABPROT -- -- -- 16.5* -- --  INR -- -- -- 1.31 -- --  HEPARINUNFRC <0.10* -- -- -- -- --  CREATININE -- 1.09 1.06 0.94 -- --  CKTOTAL -- -- -- -- 2510* 2335*  CKMB -- -- -- -- 403.2* 408.4*  TROPONINI -- -- -- -- >25.00* >25.00*   Estimated Creatinine Clearance: 81.1 ml/min (by C-G formula based on Cr of 1.09).   Medications:  Scheduled:    . antiseptic oral rinse  15 mL Mouth Rinse BID  . aspirin EC  81 mg Oral Daily  . folic acid  1 mg Oral Daily  . furosemide  40 mg Oral Daily  . heparin  2,000 Units Intravenous Once  . LORazepam  0-4 mg Intravenous Q12H  . metoprolol tartrate  75 mg Oral BID  . multivitamins ther. w/minerals  1 tablet Oral Daily  . nicotine  14 mg Transdermal Daily  . prasugrel  10 mg Oral Daily  . ramipril  5 mg Oral Daily  . rosuvastatin  40 mg Oral QHS  . vitamin B-1  100 mg Oral Daily  . DISCONTD: enoxaparin (LOVENOX) injection  40 mg Subcutaneous Q24H  . DISCONTD: furosemide  40 mg Intravenous Daily  . DISCONTD: metoprolol tartrate  50 mg Oral BID    Assessment: 64 YOM who presented with chest pain secondary to STEMI. He is s/p cath and DES.  He is in afib and on heparin gtt. First heparin level is subtherapeutic at <0.1.   Goal of Therapy:  Heparin level 0.3-0.7 units/ml   Plan:  Heparin 2500 unit bolus then increase rate  to 1650 units/hr.  Will recheck heparin level in 6hrs after rate change  Suzette Battiest, Tad Moore 03/02/2011,5:16 PM

## 2011-03-03 LAB — CBC
Hemoglobin: 15.1 g/dL (ref 13.0–17.0)
MCH: 33.5 pg (ref 26.0–34.0)
Platelets: 161 10*3/uL (ref 150–400)
RBC: 4.51 MIL/uL (ref 4.22–5.81)
WBC: 8.8 10*3/uL (ref 4.0–10.5)

## 2011-03-03 LAB — BASIC METABOLIC PANEL
BUN: 16 mg/dL (ref 6–23)
Chloride: 106 mEq/L (ref 96–112)
GFR calc Af Amer: >90 mL/min (ref >90)
GFR calc non Af Amer: 88 mL/min — ABNORMAL LOW (ref >90)
Potassium: 3.8 mEq/L (ref 3.5–5.1)
Sodium: 141 mEq/L (ref 135–145)

## 2011-03-03 LAB — HEPARIN LEVEL (UNFRACTIONATED)
Heparin Unfractionated: 0.15 IU/mL — ABNORMAL LOW (ref 0.30–0.70)
Heparin Unfractionated: 0.4 IU/mL (ref 0.30–0.70)

## 2011-03-03 MED ORDER — DOCUSATE SODIUM 100 MG PO CAPS
100.0000 mg | ORAL_CAPSULE | Freq: Two times a day (BID) | ORAL | Status: DC
  Administered 2011-03-03 – 2011-03-05 (×5): 100 mg via ORAL
  Filled 2011-03-03 (×8): qty 1

## 2011-03-03 MED ORDER — METOPROLOL TARTRATE 100 MG PO TABS
100.0000 mg | ORAL_TABLET | Freq: Two times a day (BID) | ORAL | Status: DC
  Administered 2011-03-03: 100 mg via ORAL
  Filled 2011-03-03 (×2): qty 1

## 2011-03-03 MED ORDER — HEPARIN BOLUS VIA INFUSION
2000.0000 [IU] | Freq: Once | INTRAVENOUS | Status: AC
  Administered 2011-03-03: 2000 [IU] via INTRAVENOUS
  Filled 2011-03-03: qty 2000

## 2011-03-03 MED ORDER — METOPROLOL TARTRATE 100 MG PO TABS
100.0000 mg | ORAL_TABLET | Freq: Two times a day (BID) | ORAL | Status: DC
  Administered 2011-03-03 – 2011-03-05 (×4): 100 mg via ORAL
  Filled 2011-03-03 (×5): qty 1

## 2011-03-03 MED ORDER — SPIRONOLACTONE 25 MG PO TABS
25.0000 mg | ORAL_TABLET | Freq: Every day | ORAL | Status: DC
  Administered 2011-03-03 – 2011-03-05 (×3): 25 mg via ORAL
  Filled 2011-03-03 (×3): qty 1

## 2011-03-03 MED ORDER — MORPHINE SULFATE 4 MG/ML IJ SOLN: 2.0000 mg | INTRAMUSCULAR | Status: DC | PRN

## 2011-03-03 NOTE — Progress Notes (Signed)
[place] CARDIOLOGY - PGY II RESIDENT NOTE   Subjective:  There were events overnight - particularly, the patient had approximately a 5 minute episode of shortness of breath that occurred after he was walking the halls, at that time it was thought to be contributed by anxiety. Therefore he was treated with morphine x1, with resolution of the symptoms. Currently, the patient denies symptoms of chest pain, chest pressure/discomfort, dyspnea, fatigue, irregular heart beat and palpitations. No BM in 2 days, tingling sensation with the nicotine patch.   Objective:  Vital Signs:   BP 104/88  Pulse 84  Temp(Src) 97.7 F (36.5 C) (Oral)  Resp 20  Ht 6' (1.829 m)  Wt 190 lb 4.1 oz (86.3 kg)  BMI 25.80 kg/m2  SpO2 97%    Intake/Output:  11/05 0701 - 11/06 0700 In: 1435.5 [P.O.:840; I.V.:595.5] Out: 2350 [Urine:2350]   Physical Exam: General: Vital signs reviewed and noted. Well-developed, well-nourished, in no acute distress; alert, appropriate and cooperative throughout examination.  Lungs:  Normal respiratory effort. Clear to auscultation BL without crackles or wheezes.  Heart: Regular rate, irregular rhythm. S1 and S2 normal without gallop, murmur, or rubs.  Abdomen:  BS normoactive. Soft, Nondistended, non-tender.  No masses or organomegaly.  Extremities: No pretibial edema.     Labs: Basic Metabolic Panel:  Lab 03/02/11 1610 03/01/11 0650 02/28/11 0156  NA 136 137 137  K 4.2 4.3 4.7  CL 101 105 106  CO2 25 24 20   GLUCOSE 96 92 109*  BUN 22 25* 20  CREATININE 1.09 1.06 0.94  CALCIUM 8.8 8.4 8.1*  MG -- 1.8 1.8  PHOS -- -- --    Liver Function Tests:  Lab 02/27/11 1150  AST 50*  ALT 58*  ALKPHOS 72  BILITOT 0.7  PROT 5.6*  ALBUMIN 3.2*   CBC:  Lab 03/03/11 0520 03/02/11 0533 02/28/11 0156 02/27/11 1150 02/27/11 0956  WBC 8.8 7.4 7.5 -- --  NEUTROABS -- -- 5.0 -- 6.4  HGB 15.1 13.9 14.6 -- --  HCT 43.0 42.6 42.3 -- --  MCV 95.3 96.6 95.9 95.2 95.8  PLT 161  161 179 -- --    Microbiology: Results for orders placed during the hospital encounter of 02/27/11  MRSA PCR SCREENING     Status: Normal   Collection Time   02/27/11 12:46 PM      Component Value Range Status Comment   MRSA by PCR NEGATIVE  NEGATIVE  Final      Medications:  Infusions:    . sodium chloride 10 mL/hr at 03/03/11 0600  . heparin 19.5 mL/hr (03/03/11 0600)    Scheduled Medications:    . antiseptic oral rinse  15 mL Mouth Rinse BID  . aspirin EC  81 mg Oral Daily  . folic acid  1 mg Oral Daily  . furosemide  40 mg Oral Daily  . heparin  2,000 Units Intravenous Once  . heparin  2,000 Units Intravenous Once  . heparin  2,500 Units Intravenous Once  . hydrALAZINE  25 mg Oral Q8H  . LORazepam  0-4 mg Intravenous Q12H  . metoprolol tartrate  75 mg Oral BID  . multivitamins ther. w/minerals  1 tablet Oral Daily  . nicotine  14 mg Transdermal Daily  . prasugrel  10 mg Oral Daily  . ramipril  10 mg Oral Daily  . rosuvastatin  40 mg Oral QHS  . vitamin B-1  100 mg Oral Daily    PRN Medications: acetaminophen, LORazepam, morphine,  nitroGLYCERIN, oxyCODONE-acetaminophen    Assessment/ Plan:  Pt is a 60 y.o. yo male who was admitted on 02/27/2011 with symptoms of chest pain, which was determined to be secondary to STEMI, for which he went emergently to cath lab with DES placed in his RCA on 02/27/2011. Interventions at this time will be focused on stabilization of pt's alcohol withdrawal symptoms to allow for possible recath at a later date with consideration for intervention of remaining moderate to severe lesions.    CAD (coronary artery disease): Stable. He is s/p a drug eluting stent in totally occluded RCA on 02/28/11. He has moderate to severe residual disease in the Circumflex and RCA.   Will continue ASA, Effient, statin, beta blocker.   Plan staged intervention of the RCA and Circumflex at a later date.   Ischemic cardiomyopathy: Biventricular failure  with LVEF of 15%. His LV and RV are dilated. This is likely a mixed cardiomyopathy with history of alcohol abuse and severe CAD. The degree of biventricular failure seems out of proportion to his level of CAD though. Suspect alcohol has played a large part in this.   Altace and metoprolol were increased on 03/01/11, Toprol further increased to 75 mg by mouth twice a day on 03/02/2011.   As his heart rate continued to remain in the 90s, can consider further escalation of beta blocker. Increase to 100 mg bid  Will also consider increasing Ramipril dosage.  Furosemide is now an oral regimen.  Will add Spironolactone.   Atrial fibrillation: Rate controlled at this time with increased of beta blocker yesterday. Has not required restarting of amiodarone that was first started in the catheter lab. Was started on heparin drip on 03/02/2011 with thought to possibly recath either during this hospitalization or on close followup. Although the patient will likely require anticoagulation, future anticoagulation will be of concern if the patient continues to abuse alcohol.  Discontinue anticoagulation with multiple risks of complications with alcohol and for safety.  Continue metoprolol 75 mg by mouth twice a day, with consideration for escalation of dosage allow for better heart rate control.  Continued elevation of heart rate may be at least partly contributed by alcohol withdrawal.   Alcohol withdrawal:   Continue CIWA protocol, and all symptoms are to a minimum.   Continue encourage alcohol cessation, as future anticoagulation will be of concern if he continues with alcohol abuse.   Tobacco abuse: The patient indicates anxiety and tingling sensations with the nicotine patch. Therefore, he prefers to discontinue the patch. Patient plans to pursue smoking cessation without the help of pharmacologic agents.  Continue encouraging smoking cessation.  Will discontinue nicotine patch secondary to side  effects.  Has been provided smoking cessation counseling during hospital course, with information given regarding 1-800-QUIT-NOW.   Constipation: Please partly contributed by narcotics required for chest pain, and limited ambulation.  Will add Colace.  We'll consider adding MiraLax versus milk of magnesia as needed, if Colace is ineffective.   This patient's case and plan of care was discussed with attending, Valera Castle.   Johnette Abraham, Norman Herrlich, Internal Medicine Resident 03/03/2011, 6:58 AM  Patient examined and agree except changes made. Increase BB, discontinue Heparin, start spironolactone, and increase ramipril.

## 2011-03-03 NOTE — Progress Notes (Addendum)
CARDIAC REHAB PHASE I   PRE:  Rate/Rhythm: 100-120 Afib   BP:  Sitting: 141/92     SaO2: 98 3L  MODE:  Ambulation: 200 ft   POST:  Rate/Rhythm: 178 Afib, rest, then 145 rest of walk  BP:  Sitting: 142/114, 125/104 5 min later     SaO2: 96 RA  Pt HR up to 178 Afib after 40 ft. Ambulating.  Down to 100 Afib with rest in chair.  Pt then breathless 1 min after sitting. Lasted 1 min.  Able to finish short walk (238ft total) with HR in 140's. Again, after sitting in recliner, c/o SOB. Resolved after 1 min. BP elevated. Reinforced restrictions, ETOH, smoking, HF.  Will f/u. [id number]  Harriet Masson CES, ACSM

## 2011-03-03 NOTE — Progress Notes (Signed)
ANTICOAGULATION CONSULT NOTE - Follow Up Consult  Pharmacy Consult for Heparin Indication: atrial fibrillation  No Known Allergies  Patient Measurements: Height: 6' (182.9 cm) Weight: 199 lb 11.8 oz (90.6 kg) (bed) IBW/kg (Calculated) : 77.6  Adjusted Body Weight: 90.6 kg  Vital Signs: Temp: 97.8 F (36.6 C) (11/06 0000) Temp src: Oral (11/06 0000) BP: 127/100 mmHg (11/05 2300) Pulse Rate: 84  (11/05 1600)  Labs:  Basename 03/02/11 2353 03/02/11 1529 03/02/11 0533 03/01/11 0650 02/28/11 0156 02/28/11 0148  HGB -- -- 13.9 -- 14.6 --  HCT -- -- 42.6 -- 42.3 --  PLT -- -- 161 -- 179 --  APTT -- -- -- -- -- --  LABPROT -- -- -- -- 16.5* --  INR -- -- -- -- 1.31 --  HEPARINUNFRC 0.15* <0.10* -- -- -- --  CREATININE -- -- 1.09 1.06 0.94 --  CKTOTAL -- -- -- -- -- 2510*  CKMB -- -- -- -- -- 403.2*  TROPONINI -- -- -- -- -- >25.00*   Estimated Creatinine Clearance: 81.1 ml/min (by C-G formula based on Cr of 1.09).   Medications:  Scheduled:     . antiseptic oral rinse  15 mL Mouth Rinse BID  . aspirin EC  81 mg Oral Daily  . folic acid  1 mg Oral Daily  . furosemide  40 mg Oral Daily  . heparin  2,000 Units Intravenous Once  . heparin  2,000 Units Intravenous Once  . heparin  2,500 Units Intravenous Once  . hydrALAZINE  25 mg Oral Q8H  . LORazepam  0-4 mg Intravenous Q12H  . metoprolol tartrate  75 mg Oral BID  . multivitamins ther. w/minerals  1 tablet Oral Daily  . nicotine  14 mg Transdermal Daily  . prasugrel  10 mg Oral Daily  . ramipril  10 mg Oral Daily  . rosuvastatin  40 mg Oral QHS  . vitamin B-1  100 mg Oral Daily  . DISCONTD: enoxaparin (LOVENOX) injection  40 mg Subcutaneous Q24H  . DISCONTD: furosemide  40 mg Intravenous Daily  . DISCONTD: metoprolol tartrate  50 mg Oral BID  . DISCONTD: ramipril  5 mg Oral Daily    Assessment: 75 YOM who presented with chest pain secondary to STEMI. He is s/p cath and DES.  He is in afib and on heparin gtt.  Heparin level subtherapeutic.   Goal of Therapy:  Heparin level 0.3-0.7 units/ml   Plan:  Heparin 2000 unit bolus then increase rate to 1950 units/hr.  Will recheck heparin level in 6hrs after rate change  Jamacia Jester, Hilario Quarry 03/03/2011,1:29 AM

## 2011-03-04 LAB — BASIC METABOLIC PANEL
Chloride: 100 mEq/L (ref 96–112)
Creatinine, Ser: 1.06 mg/dL (ref 0.50–1.35)
GFR calc Af Amer: 88 mL/min — ABNORMAL LOW (ref >90)
Sodium: 137 mEq/L (ref 135–145)

## 2011-03-04 MED ORDER — PNEUMOCOCCAL VAC POLYVALENT 25 MCG/0.5ML IJ INJ
0.5000 mL | INJECTION | INTRAMUSCULAR | Status: AC
  Administered 2011-03-05: 0.5 mL via INTRAMUSCULAR
  Filled 2011-03-04: qty 0.5

## 2011-03-04 MED ORDER — INFLUENZA VIRUS VACC SPLIT PF IM SUSP
0.5000 mL | INTRAMUSCULAR | Status: AC
  Administered 2011-03-05: 0.5 mL via INTRAMUSCULAR
  Filled 2011-03-04: qty 0.5

## 2011-03-04 NOTE — Progress Notes (Signed)
CARDIAC REHAB PHASE I   PRE:  Rate/Rhythm: 90-100 Afib   BP:  Sitting: 130/90    SaO2:   MODE:  Ambulation: 700 ft   POST:  Rate/Rhythm: 117 Afib  BP:  Sitting: 150/116     SaO2:   Pt. Tolerated ambulation much better today. HR controlled in 110s while walking.  Patient sts no SOB or breathlessness feeling like he was having yesterday with activity.  BP continues to be elevated.  Discussed in depth HF, restrictions, warning signs. Requests referral for CRPII for after staged PCI.  Pt motivated and wants to quit smoking and ETOH.  Wants to talk with CSW. [id number]  Harriet Masson CES, ACSM

## 2011-03-04 NOTE — Progress Notes (Signed)
SUBJECTIVE:Breathing much better today. NO chest pain. No dizziness. He has had several pauses on telemetry with atrial fibrillation.   BP 115/112  Pulse 84  Temp(Src) 97.8 F (36.6 C) (Oral)  Resp 18  Ht 6' (1.829 m)  Wt 190 lb 3.2 oz (86.274 kg)  BMI 25.80 kg/m2  SpO2 90%   Intake/Output Summary (Last 24 hours) at 03/04/11 0756 Last data filed at 03/03/11 1300  Gross per 24 hour  Intake  519.5 ml  Output    400 ml  Net  119.5 ml    PHYSICAL EXAM General: Well developed, well nourished, in no acute distress. Alert and oriented x 3.  Psych:  Good affect, responds appropriately Neck: No JVD. No masses noted.  Lungs: Clear bilaterally with no wheezes or rhonci noted.  Heart: Irregular with no murmurs noted. Abdomen: Bowel sounds are present. Soft, non-tender.  Extremities: No lower extremity edema.   LABS: Basic Metabolic Panel:  Basename 03/04/11 0545 03/03/11 0820  NA 137 141  K 3.8 3.8  CL 100 106  CO2 26 26  GLUCOSE 104* 91  BUN 17 16  CREATININE 1.06 0.99  CALCIUM 9.1 8.9  MG -- --  PHOS -- --   CBC:  Basename 03/03/11 0520 03/02/11 0533  WBC 8.8 7.4  NEUTROABS -- --  HGB 15.1 13.9  HCT 43.0 42.6  MCV 95.3 96.6  PLT 161 161    Current Meds:    . antiseptic oral rinse  15 mL Mouth Rinse BID  . aspirin EC  81 mg Oral Daily  . docusate sodium  100 mg Oral BID  . folic acid  1 mg Oral Daily  . furosemide  40 mg Oral Daily  . hydrALAZINE  25 mg Oral Q8H  . LORazepam  0-4 mg Intravenous Q12H  . metoprolol tartrate  100 mg Oral BID  . multivitamins ther. w/minerals  1 tablet Oral Daily  . prasugrel  10 mg Oral Daily  . ramipril  10 mg Oral Daily  . rosuvastatin  40 mg Oral QHS  . spironolactone  25 mg Oral Daily  . thiamine  100 mg Oral Daily    ASSESSMENT AND PLAN:   Pt is a 60 y.o. yo male who was admitted on 02/27/2011 with symptoms of chest pain, which was determined to be secondary to STEMI, for which he went emergently to cath lab with DES  placed in his RCA on 02/27/2011.   CAD (coronary artery disease): Stable. Admitted with inferior STEMI.  He is s/p a drug eluting stent in totally occluded RCA on 02/28/11. He has moderate to severe residual disease in the Circumflex and RCA.  Will continue ASA, Effient, statin, beta blocker.  Plan staged intervention of the RCA and Circumflex at a later date, likely after discharge home in several weeks.   Ischemic cardiomyopathy: Biventricular failure with LVEF of 15%. His LV and RV are dilated. This is likely a mixed cardiomyopathy with history of alcohol abuse and severe CAD. The degree of biventricular failure seems out of proportion to his level of CAD though. Suspect alcohol has played a large part in this.  Continue beta blocker, aldactone, hydralazine and Ace-inhibitor.  Volume ok on oral Lasix.  He will need consideration for an ICD if LVEF still less than 35%  In 3 months after optimal medical therapy, revascularization, rate control of atrial fibrillation.    Atrial fibrillation: Rate controlled at this time with increased of beta blocker. Was started on heparin drip  on 03/02/2011 with thought to possibly recath either during this hospitalization or on close followup. Although the patient will likely require anticoagulation, future anticoagulation will be of concern if the patient continues to abuse alcohol.  Will not start coumadin with multiple risks of complications with alcohol and for safety.  Continue metoprolol 100 mg by mouth twice a day for rate control.   Alcohol withdrawal:  Continue CIWA protocol, and all symptoms are to a minimum.  Continue encourage alcohol cessation, as future anticoagulation will be of concern if he continues with alcohol abuse.   Tobacco abuse: Nicotine patch caused tingling. Continue encouraging smoking cessation.  Will discontinue nicotine patch secondary to side effects.  Has been provided smoking cessation counseling during hospital course,  with information given regarding 1-800-QUIT-NOW.  Constipation: Resolved. Please partly contributed by narcotics required for chest pain, and limited ambulation.  Colace added yesterday.     Disposition: Likely d/c home in am.    MCALHANY,CHRISTOPHER  11/7/20127:56 AM

## 2011-03-04 NOTE — Progress Notes (Signed)
Clinical Social Worker completed the psychosocial assessment, which can be found in the shadow chart. Patient did not have current concerns with alcohol abuse and appears to be aware of the consequences to his health if he continues to abuse alcohol. CSW provided information for alcohol counseling at patient's request.   [phone]

## 2011-03-05 ENCOUNTER — Telehealth: Payer: Self-pay | Admitting: Cardiovascular Disease

## 2011-03-05 DIAGNOSIS — Z720 Tobacco use: Secondary | ICD-10-CM | POA: Diagnosis present

## 2011-03-05 DIAGNOSIS — I2510 Atherosclerotic heart disease of native coronary artery without angina pectoris: Secondary | ICD-10-CM

## 2011-03-05 DIAGNOSIS — I2119 ST elevation (STEMI) myocardial infarction involving other coronary artery of inferior wall: Secondary | ICD-10-CM | POA: Diagnosis present

## 2011-03-05 DIAGNOSIS — F1010 Alcohol abuse, uncomplicated: Secondary | ICD-10-CM | POA: Diagnosis present

## 2011-03-05 LAB — BASIC METABOLIC PANEL
CO2: 24 mEq/L (ref 19–32)
Calcium: 9.2 mg/dL (ref 8.4–10.5)
GFR calc non Af Amer: 71 mL/min — ABNORMAL LOW (ref >90)
Potassium: 4.1 mEq/L (ref 3.5–5.1)
Sodium: 139 mEq/L (ref 135–145)

## 2011-03-05 LAB — CBC
Hemoglobin: 15.7 g/dL (ref 13.0–17.0)
MCV: 94.6 fL (ref 78.0–100.0)
Platelets: 188 10*3/uL (ref 150–400)
RBC: 5.01 MIL/uL (ref 4.22–5.81)
WBC: 6.3 10*3/uL (ref 4.0–10.5)

## 2011-03-05 MED ORDER — HYDRALAZINE HCL 25 MG PO TABS: 25.0000 mg | ORAL_TABLET | Freq: Three times a day (TID) | ORAL | Status: DC

## 2011-03-05 MED ORDER — METOPROLOL TARTRATE 100 MG PO TABS: 100.0000 mg | ORAL_TABLET | Freq: Two times a day (BID) | ORAL | Status: DC

## 2011-03-05 MED ORDER — NITROGLYCERIN 0.4 MG SL SUBL: 0.4000 mg | SUBLINGUAL_TABLET | SUBLINGUAL | Status: DC | PRN

## 2011-03-05 MED ORDER — FUROSEMIDE 40 MG PO TABS: 40.0000 mg | ORAL_TABLET | Freq: Every day | ORAL | Status: DC

## 2011-03-05 MED ORDER — ASPIRIN 81 MG PO TBEC: 81.0000 mg | DELAYED_RELEASE_TABLET | Freq: Every day | ORAL | Status: DC

## 2011-03-05 MED ORDER — PRASUGREL HCL 10 MG PO TABS: 10.0000 mg | ORAL_TABLET | Freq: Every day | ORAL | Status: DC

## 2011-03-05 MED ORDER — LISINOPRIL 10 MG PO TABS: 40.0000 mg | ORAL_TABLET | Freq: Every day | ORAL | Status: DC

## 2011-03-05 MED ORDER — ATORVASTATIN CALCIUM 80 MG PO TABS: 80.0000 mg | ORAL_TABLET | Freq: Every day | ORAL | Status: DC

## 2011-03-05 MED ORDER — LISINOPRIL 10 MG PO TABS: 10.0000 mg | ORAL_TABLET | Freq: Every day | ORAL | Status: DC

## 2011-03-05 MED ORDER — SPIRONOLACTONE 25 MG PO TABS: 25.0000 mg | ORAL_TABLET | Freq: Every day | ORAL | Status: DC

## 2011-03-05 NOTE — Progress Notes (Signed)
Pt having intermittent pauses on tele.  At 2320 pt had 4.33 sec pause--sleeping soundly, easily aroused, BP 116/83, no complaints voiced.  At 0039 pt had 4.96 sec pause --again easily aroused/no complaints voiced.  Per telemetry review pt had a 6.89 sec pause 03/04/11 at 0428.  Strips posted--will cont to monitor. Dierdre Highman

## 2011-03-05 NOTE — Telephone Encounter (Signed)
Spoke with pharmacist who is requesting clarification of lisinopril prescription instructions. Pt was discharged from hospital today.

## 2011-03-05 NOTE — Progress Notes (Signed)
Patient given discharge instructions and questions/concerns answered. IV discontinued and site unremarkable. Vital signs stable. No personal belongings left in room. Patient taken down in wheelchair to personal vehicle.

## 2011-03-05 NOTE — Telephone Encounter (Signed)
Reviewed with Dr. Clifton James. Pt is to be on Lisinopril 10 mg by mouth daily and to continue lisinopril and spironolactone. He is also to continue SL NTG as needed.  Dr. Clifton James called pharmacy and left these instructions on pharmacy voicemail with instructions to call back if any additional questions.

## 2011-03-05 NOTE — Discharge Summary (Signed)
Patient ID: Andres Ward,  MRN: [id number], DOB/AGE: 07/14/1950 60 y.o.  Admit date: 02/27/2011 Discharge date: 03/05/2011  Primary MD: None Primary Cardiologist: Verne Carrow, M.D.  Discharge Diagnoses  *ST elevation myocardial infarction (STEMI) of inferior wall Secondary Diagnoses  CAD (coronary artery disease)  Ischemic cardiomyopathy  Atrial fibrillation  Tobacco abuse  Alcohol abuse  Allergies No Known Allergies  Procedures Cardiac Cath 02/27/11 Impression: 1. Left main: No evidence of disease.   2. LAD: Mild plaque disease throughout its mid and distal portion, but no focally obstructive lesions. The ostium of this branch had approximately 50% stenosis.  3. Circumflex: 70% proximal stenosis and then gives off a large bifurcating obtuse marginal       branch which has a 40% stenosis.  The mid vessel just after the takeoff of the first obtuse marginal branch has a 80% stenosis. The vessel then gives off a second obtuse marginal branch which has a 40% ostial stenosis.  The distal vessel just beyond the takeoff of the second obtuse marginal branch has 99% stenosis and there is approximately a 2 mm vessel in this location.   4. RCA: 100% proximal occlusion.   5. Left ventricular angiogram: Ejection fraction was approximately 35%. Intervention: 3.0 x 16 mm PROMUS element DES to mid RCA  Transthoracic Echocardiography 02/27/11  * Moderately dilated LV with severe global hypokinesis, EF 15%. The posterior wall and the apical segments were relatively preserved. Severe diastolic dysfunction. Mild to moderate mitral regurgitation. The right ventricle was moderately dilated with moderately decreased systolic function. Mild to moderate pulmonary hypertension. Dilated IVC suggestive of elevated RV filling pressure. The patient presented with an inferior STEMI but echo   appearance is most consistent with a dilated cardiomyopathy.  History of Present Illness 60 year old male w/ PMH  of tobacco and alcohol abuse, who presented to the [facility] for evaluation of acute, sudden onset substernal chest pain. In the [facility] ER, his EKG revealed an inferior wall ST-elevation MI as well as atrial fibrillation. The patient was subsequently transferred to [facility] for emergent PCI via code STEMI protocol.     Hospital Course Upon presentation to [facility] he underwent emergent left heart catheterization where a DES was placed in the mid RCA for a 100% proximal occlusion. During cath he became hypotensive and was placed on dopamine as well as amiodarone for his VT and was transferred to the ICU post cath. Amiodarone and dopamine were dc'd as he was stabilized. An echo was completed post cath and showed severe global hypokinesis with an EF of 15%. During this admission, he was started on aldactone, hydralazine, lasix, and an ACE for his ischemic cardiomyopathy, as well as ASA, Effient, beta blocker, and a statin for his CAD s/p stent placement. He remained in atrial fibrillation throughout the hospitalization, but was rate controlled on metoprolol tartrate that was titrated to 100mg  bid. Coumadin was not started due to multiple risks of complications with alcohol and for safety and need for Effient. He was monitored with the CIWA protocol for alcohol withdrawal and was without complications from alcohol cessation. He was also provided tobacco cessation counseling. Upon discharge he had no further complaints of chest pain and was ambulating without difficulty. He will follow up with Dr. Clifton James for planned staged intervention of the RCA and Circumflex at a later date as well as monitor and optimization of medical therapy for his ICM. He will need consideration for an ICD if LVEF remains <35% after  3mos of optimal medical therapy, revascularization, and rate control of atrial fibrillation.  Discharge Vitals:  Blood pressure 123/89, pulse 101, temperature 97.8 F (36.6  C), temperature source Oral, resp. rate 20, height 6' (1.829 m), weight 185 lb 8 oz (84.142 kg), SpO2 95.00%.    Labs:   Lab Results  Component Value Date   WBC 6.3 03/05/2011   HGB 15.7 03/05/2011   HCT 47.4 03/05/2011   MCV 94.6 03/05/2011   PLT 188 03/05/2011    Lab 03/05/11 0640 02/27/11 1150  NA 139 --  K 4.1 --  CL 103 --  CO2 24 --  BUN 16 --  CREATININE 1.12 --  CALCIUM 9.2 --  PROT -- 5.6*  BILITOT -- 0.7  ALKPHOS -- 72  ALT -- 58*  AST -- 50*  GLUCOSE 94 --   Cardiac Enzymes:   Basename  02/28/11 0148  02/27/11 2027  02/27/11 1150   CKTOTAL  2510*  2335*  207   CKMB  403.2*  408.4*  22.0*   CKMBINDEX  --  --  --   TROPONINI  >25.00*  >25.00*  2.31*     Lab Results  Component Value Date   CHOL 166 02/28/2011   Lab Results  Component Value Date   HDL 37* 02/28/2011   Lab Results  Component Value Date   LDLCALC 108* 02/28/2011   Lab Results  Component Value Date   TRIG 105 02/28/2011   Lab Results  Component Value Date   CHOLHDL 4.5 02/28/2011      Discharge Orders    Future Appointments: Provider: Department: Dept Phone: Center:   03/26/2011 11:00 AM Verne Carrow, MD [facility] [phone] [facility]     Follow-up Information    Follow up with Verne Carrow, MD on 03/26/2011. (11:00)    Contact information:   [facility] [address] [place] [address] [phone]          Discharge Medications:  Current Discharge Medication List    START taking these medications   Details  aspirin EC 81 MG EC tablet Take 1 tablet (81 mg total) by mouth daily.    atorvastatin (LIPITOR) 80 MG tablet Take 1 tablet (80 mg total) by mouth at bedtime. Qty: 30 tablet, Refills: 6    furosemide (LASIX) 40 MG tablet Take 1 tablet (40 mg total) by mouth daily. Qty: 30 tablet, Refills: 6    hydrALAZINE (APRESOLINE) 25 MG tablet Take 1 tablet (25 mg total) by mouth every 8 (eight) hours. Qty:  90 tablet, Refills: 6    lisinopril (ZESTRIL) 10 MG tablet Take 4 tablets (40 mg total) by mouth daily. Qty: 30 tablet, Refills: 6    metoprolol (LOPRESSOR) 100 MG tablet Take 1 tablet (100 mg total) by mouth 2 (two) times daily. Qty: 60 tablet, Refills: 6   Associated Diagnoses: CAD (coronary artery disease)    nitroGLYCERIN (NITROSTAT) 0.4 MG SL tablet Place 1 tablet (0.4 mg total) under the tongue every 5 (five) minutes x 3 doses as needed for chest pain. Qty: 25 tablet, Refills: 3    prasugrel (EFFIENT) 10 MG TABS Take 1 tablet (10 mg total) by mouth daily. Qty: 30 tablet, Refills: 6    spironolactone (ALDACTONE) 25 MG tablet Take 1 tablet (25 mg total) by mouth daily. Qty: 30 tablet, Refills: 6      STOP taking these medications     ibuprofen (ADVIL,MOTRIN) 200 MG tablet  Outstanding Labs/Studies Will need a BMET as an outpatient to monitor K+  Duration of Discharge Encounter: Greater than 30 minutes including physician time.  Signed, Evadne Ose, PA-C 03/05/2011, 12:01 PM

## 2011-03-05 NOTE — Telephone Encounter (Signed)
They need clarification on on Rx written today by the [facility]. The pt was discharged today

## 2011-03-05 NOTE — Discharge Summary (Signed)
I have seen this patient this am and documented my encounter with another note in addition to his d/c summary. cdm

## 2011-03-05 NOTE — Progress Notes (Signed)
SUBJECTIVE: No complaints this am. He feels "better than in 6 months". No chest pain. No SOB. Several 4 second pauses on telemetry. Baseline atrial fibrillation.   BP 127/89  Pulse 78  Temp(Src) 97.8 F (36.6 C) (Oral)  Resp 20  Ht 6' (1.829 m)  Wt 185 lb 8 oz (84.142 kg)  BMI 25.16 kg/m2  SpO2 95%  Intake/Output Summary (Last 24 hours) at 03/05/11 0653 Last data filed at 03/04/11 0700  Gross per 24 hour  Intake    360 ml  Output      0 ml  Net    360 ml    PHYSICAL EXAM General: Well developed, well nourished, in no acute distress. Alert and oriented x 3.  Psych:  Good affect, responds appropriately Neck: No JVD. No masses noted.  Lungs: Clear bilaterally with no wheezes or rhonci noted.  Heart: RRR with no murmurs noted. Abdomen: Bowel sounds are present. Soft, non-tender.  Extremities: No lower extremity edema.   LABS: Basic Metabolic Panel:  Basename 03/04/11 0545 03/03/11 0820  NA 137 141  K 3.8 3.8  CL 100 106  CO2 26 26  GLUCOSE 104* 91  BUN 17 16  CREATININE 1.06 0.99  CALCIUM 9.1 8.9  MG -- --  PHOS -- --   CBC:  Basename 03/03/11 0520  WBC 8.8  NEUTROABS --  HGB 15.1  HCT 43.0  MCV 95.3  PLT 161   Current Meds:    . antiseptic oral rinse  15 mL Mouth Rinse BID  . aspirin EC  81 mg Oral Daily  . docusate sodium  100 mg Oral BID  . folic acid  1 mg Oral Daily  . furosemide  40 mg Oral Daily  . hydrALAZINE  25 mg Oral Q8H  . influenza  inactive virus vaccine  0.5 mL Intramuscular Tomorrow-1000  . metoprolol tartrate  100 mg Oral BID  . multivitamins ther. w/minerals  1 tablet Oral Daily  . pneumococcal 23 valent vaccine  0.5 mL Intramuscular Tomorrow-1000  . prasugrel  10 mg Oral Daily  . ramipril  10 mg Oral Daily  . rosuvastatin  40 mg Oral QHS  . spironolactone  25 mg Oral Daily  . thiamine  100 mg Oral Daily     ASSESSMENT AND PLAN: Pt is a 60 y.o. yo male who was admitted on 02/27/2011 with symptoms of chest pain, which was  determined to be secondary to STEMI, for which he went emergently to cath lab with DES placed in his RCA on 02/27/2011.   CAD (coronary artery disease): Stable. Admitted with inferior STEMI. He is s/p a drug eluting stent in totally occluded RCA on 02/28/11. He has moderate to severe residual disease in the Circumflex and RCA.  Will continue ASA, Effient, statin, beta blocker.  Plan staged intervention of the RCA and Circumflex at his f/u appt with me.    Ischemic cardiomyopathy: Biventricular failure with LVEF of 15%. His LV and RV are dilated. This is likely a mixed cardiomyopathy with history of alcohol abuse and severe CAD. The degree of biventricular failure seems out of proportion to his level of CAD though. Suspect alcohol has played a large part in this.  Continue beta blocker, aldactone, hydralazine and Ace-inhibitor.  Volume ok on oral Lasix.  He will need consideration for an ICD if LVEF still less than 35% In 3 months after optimal medical therapy, revascularization, rate control of atrial fibrillation.    Atrial fibrillation: Rate controlled at this  time with increased of beta blocker.   Will not start coumadin with multiple risks of complications with alcohol and for safety and need for Effient. If he demonstrates that he is not drinking, we will consider anticoagulation in the future.  Continue metoprolol 100 mg by mouth twice a day for rate control.   Alcohol withdrawal:  No evidence of withdrawal now. D/C folate and thiamine before d/c.   Tobacco abuse:  Continue encouraging smoking cessation. Nicotine patch stopped secondary to tingling. Has been provided smoking cessation counseling during hospital course, with information given regarding 1-800-QUIT-NOW.                Constipation: Resolved. D/C colace.               Disposition: D/C home today on current therapy. He will need a starter packet for Effient. Effient assistance paperwork filled out and in paper chart. F/U  with me in 2-3 weeks. I will plan a date at that time for staged PCI of the RCA and Circumflex arteries.      Andres Ward  11/8/20126:53 AM

## 2011-03-06 ENCOUNTER — Telehealth: Payer: Self-pay | Admitting: Cardiovascular Disease

## 2011-03-06 NOTE — Telephone Encounter (Signed)
Needs clarification on patients lisinopril. Pt is telling Pharm he is take 4 10mg  tabs qd

## 2011-03-06 NOTE — Telephone Encounter (Signed)
Spoke with pharmacist and clarified that pt is to be on Lisinopril 10 mg by mouth daily

## 2011-03-09 ENCOUNTER — Telehealth: Payer: Self-pay | Admitting: Cardiovascular Disease

## 2011-03-09 NOTE — Telephone Encounter (Signed)
Dr Antoine Poche DOD recommended for pt to stop taken Apresoline 25 mg, and to make an appointment  to be seen in the office this week . Patient aware to hold apresoline and appointment with Tereso Newcomer on 03/11/11 at 8:30 AM.

## 2011-03-09 NOTE — Telephone Encounter (Signed)
PT REQUESTING A NURSE CALL RE PROBLEMS WITH BP, LAST READING THIS AM 90/75

## 2011-03-09 NOTE — Telephone Encounter (Signed)
Patient called because he has been having low B/P since d/C from the hospital this past Thursday. On Thursday B/P was 139/90 later on B/P has been 90/79 it continues to be low since then, Patient feels light headed when turning head, it take him a few second to focus.

## 2011-03-09 NOTE — Telephone Encounter (Signed)
Left a message to call back.

## 2011-03-10 ENCOUNTER — Encounter: Payer: Self-pay | Admitting: *Deleted

## 2011-03-11 ENCOUNTER — Ambulatory Visit (INDEPENDENT_AMBULATORY_CARE_PROVIDER_SITE_OTHER): Payer: Self-pay | Admitting: Physician Assistant

## 2011-03-11 ENCOUNTER — Encounter: Payer: Self-pay | Admitting: Physician Assistant

## 2011-03-11 DIAGNOSIS — I4891 Unspecified atrial fibrillation: Secondary | ICD-10-CM

## 2011-03-11 DIAGNOSIS — I2589 Other forms of chronic ischemic heart disease: Secondary | ICD-10-CM

## 2011-03-11 DIAGNOSIS — I255 Ischemic cardiomyopathy: Secondary | ICD-10-CM

## 2011-03-11 DIAGNOSIS — I2510 Atherosclerotic heart disease of native coronary artery without angina pectoris: Secondary | ICD-10-CM

## 2011-03-11 DIAGNOSIS — I5022 Chronic systolic (congestive) heart failure: Secondary | ICD-10-CM

## 2011-03-11 LAB — CBC WITH DIFFERENTIAL/PLATELET
Eosinophils Relative: 2.9 % (ref 0.0–5.0)
Lymphocytes Relative: 27.9 % (ref 12.0–46.0)
MCV: 97.9 fl (ref 78.0–100.0)
Monocytes Absolute: 1.3 10*3/uL — ABNORMAL HIGH (ref 0.1–1.0)
Monocytes Relative: 13.7 % — ABNORMAL HIGH (ref 3.0–12.0)
Neutrophils Relative %: 54.6 % (ref 43.0–77.0)
Platelets: 309 10*3/uL (ref 150.0–400.0)
WBC: 9.4 10*3/uL (ref 4.5–10.5)

## 2011-03-11 LAB — BASIC METABOLIC PANEL
BUN: 29 mg/dL — ABNORMAL HIGH (ref 6–23)
Calcium: 9.1 mg/dL (ref 8.4–10.5)
Creatinine, Ser: 1.3 mg/dL (ref 0.4–1.5)
GFR: 59.61 mL/min — ABNORMAL LOW (ref >60.00)

## 2011-03-11 NOTE — Assessment & Plan Note (Signed)
Doing well post inf STEMI and PCI of RCA.  We discussed the importance of dual antiplatelet therapy.  He knows to continue ASA and Effient.  Follow up with Dr. Verne Carrow 11/29 to discuss staged PCI of CFX and RCA.

## 2011-03-11 NOTE — Assessment & Plan Note (Signed)
Volume stable.  BP now better and he is asymptomatic without hydralazine.  Continue current meds.  Check BMET and CBC due to hypotension and new medications.  Follow up with Dr. Verne Carrow as planned.

## 2011-03-11 NOTE — Progress Notes (Signed)
History of Present Illness: PCP:  none Primary Cardiologist:  Dr. Verne Carrow   Andres Ward is a 60 y.o. male presents for evaluation of low BP.  He was just admitted to [facility] 11/2-11/8.  Presented to the ED at [facility] with chest pain.  Noted to be in AFib and EKGs c/w inferior STEMI.  Emergent LHC at [facility] 02/27/11: oDx 50%, pCFX 70%, mCFX 80%, OM1 40%, oOM2 40%, dCFX 99% (small - 2mm), pRCA occluded, EF 35%.  He underwent PCI with Promus DES to the pRCA.  Developed NSVT and was placed on amiodarone during LHC/PCI.  Has a h/o ETOH abuse and he was felt to be too high risk for coumadin due to this plus need for Effient with ACS/DES.  Rate control was employed with metoprolol.  Echo 02/27/11: EF 15%, mod LVE, mild to mod MR, mod BAE, mod RVE, mod decreased RVSF, PASP 48, mild to mod pulmo HTN.  Biventricular failure felt to be out of proportion to CAD and suspected to have mixed ICM/NICM possibly related to ETOH.  Lasix also started for systolic CHF.  CHF meds were titrated.  He is felt to need staged PCI in the near future for residual CFX and RCA disease and has follow up with Dr. Verne Carrow next week (03/26/11) to discuss.  Also will need consideration for ICD if EF does not recover in next 3 mos.  Called in recently with BP 90/79 and was told to hold hydralazine and follow up today.   Hospital Labs: K 4.1, creatinine 1.12, ALT 58, TC 168, TG 71, HDL 38, LDL 116, TSH 2.315, Hgb 15.7.  Feels better since stopping his hydralazine.  Was getting lightheaded.  No syncope.  The patient denies chest pain, shortness of breath, syncope, orthopnea, PND or significant pedal edema.  Feels a lot better.  Not drinking ETOH.  No smoking.  Past Medical History  Diagnosis Date  . CAD (coronary artery disease)     Inf STEMI 11/12: LHC at [facility] 02/27/11: oDx 50%, pCFX 70%, mCFX 80%, OM1 40%, oOM2 40%, dCFX 99% (small - 2mm), pRCA occluded, EF 35%.  He underwent PCI with Promus DES to the pRCA.  . Ischemic  cardiomyopathy     Echo 02/27/11: EF 15%, mod LVE, mild to mod MR, mod BAE, mod RVE, mod decreased RVSF, PASP 48, mild to mod pulmo HTN.  Marland Kitchen Chronic systolic heart failure   . Atrial fibrillation     no coumadin due to ETOH use and need for Effient  . Alcohol abuse   . Tobacco abuse   . HLD (hyperlipidemia)   . Hemorrhoids   . HTN (hypertension)     Current Outpatient Prescriptions  Medication Sig Dispense Refill  . aspirin EC 81 MG EC tablet Take 1 tablet (81 mg total) by mouth daily.      Marland Kitchen atorvastatin (LIPITOR) 80 MG tablet Take 1 tablet (80 mg total) by mouth at bedtime.  30 tablet  6  . furosemide (LASIX) 40 MG tablet Take 1 tablet (40 mg total) by mouth daily.  30 tablet  6  . lisinopril (PRINIVIL,ZESTRIL) 10 MG tablet Take 1 tablet (10 mg total) by mouth daily.  30 tablet  11  . metoprolol (LOPRESSOR) 100 MG tablet Take 1 tablet (100 mg total) by mouth 2 (two) times daily.  60 tablet  6  . nitroGLYCERIN (NITROSTAT) 0.4 MG SL tablet Place 1 tablet (0.4 mg total) under the tongue every 5 (five) minutes x 3 doses  as needed for chest pain.  25 tablet  3  . prasugrel (EFFIENT) 10 MG TABS Take 1 tablet (10 mg total) by mouth daily.  30 tablet  6  . spironolactone (ALDACTONE) 25 MG tablet Take 1 tablet (25 mg total) by mouth daily.  30 tablet  6    Allergies: No Known Allergies  History  Substance Use Topics  . Smoking status: Former Smoker -- 1.0 packs/day    Quit date: 02/26/2011  . Smokeless tobacco: Not on file  . Alcohol Use: Yes     daily     ROS:  Please see the history of present illness.   Gastrointestinal ROS: positive for - hemorrhoidal pain.  All other systems reviewed and negative.   Vital Signs: BP 113/83  Pulse 59  Ht 6' (1.829 m)  Wt 181 lb (82.101 kg)  BMI 24.55 kg/m2  Filed Vitals:   03/11/11 0908 03/11/11 0910 03/11/11 0911 03/11/11 0912  BP: 100/75 113/81 111/79 113/83  Pulse: 50 52 51 59  Height:      Weight:         PHYSICAL EXAM: Well  nourished, well developed, in no acute distress HEENT: normal Neck: no JVD Cardiac:  normal S1, S2; irreg irreg; no murmur Lungs:  clear to auscultation bilaterally, no wheezing, rhonchi or rales Abd: soft, nontender, no hepatomegaly Ext: no edema; RFA site without hematoma or bruit Skin: warm and dry Neuro:  CNs 2-12 intact, no focal abnormalities noted Psych: normal affect   EKG:   Afib, HR 74, Inf-Lat TW inversions, LVH,   ASSESSMENT AND PLAN:

## 2011-03-11 NOTE — Assessment & Plan Note (Signed)
Rate controlled.  No coumadin due to need for dual antiplatelet Rx and safety risk with h/o ETOH use.  Re-consider coumadin in future if he remains off ETOH.

## 2011-03-11 NOTE — Patient Instructions (Signed)
Keep your upcoming appt to see Dr. Clifton James on 03/26/11  Your physician recommends that you return for lab work in: TODAT CBC W/DIFF, BMET 414.01 CAD  NO CHANGES AT THIS TIME

## 2011-03-12 NOTE — Progress Notes (Signed)
Addended by: Tarri Fuller on: 03/12/2011 09:03 AM   Modules accepted: Orders

## 2011-03-20 ENCOUNTER — Telehealth: Payer: Self-pay | Admitting: Cardiovascular Disease

## 2011-03-20 NOTE — Telephone Encounter (Signed)
He is to call us if not improved on Monday.  He is to keep his appt next week.

## 2011-03-20 NOTE — Telephone Encounter (Signed)
Andres Ward is calling requesting that his medications be adjusted.  He states he just purchased a BP monitor and his bp was 88/62, 86/61, 89/67.   He states he is very tired.  He denies sob, dizziness, chest pain, etc.

## 2011-03-20 NOTE — Telephone Encounter (Signed)
New message:  Pt states his blood pressure has been low for 3 days.  He is concerned with the week end coming that the medicine may need adjusting.  Call [phone].

## 2011-03-20 NOTE — Telephone Encounter (Signed)
Pt to hold lasix for two days, then decrease it to 20mg  per day, and decrease lisinopril to 5mg  daily per Dr Eden Emms.  Pt was notified.

## 2011-03-26 ENCOUNTER — Encounter: Payer: Self-pay | Admitting: Cardiovascular Disease

## 2011-03-26 ENCOUNTER — Ambulatory Visit (INDEPENDENT_AMBULATORY_CARE_PROVIDER_SITE_OTHER): Payer: Self-pay | Admitting: Cardiovascular Disease

## 2011-03-26 ENCOUNTER — Telehealth: Payer: Self-pay | Admitting: *Deleted

## 2011-03-26 ENCOUNTER — Other Ambulatory Visit (INDEPENDENT_AMBULATORY_CARE_PROVIDER_SITE_OTHER): Payer: Medicaid Other | Admitting: *Deleted

## 2011-03-26 VITALS — BP 130/86 | HR 85 | Ht 72.0 in | Wt 182.0 lb

## 2011-03-26 DIAGNOSIS — I4891 Unspecified atrial fibrillation: Secondary | ICD-10-CM

## 2011-03-26 DIAGNOSIS — Z720 Tobacco use: Secondary | ICD-10-CM

## 2011-03-26 DIAGNOSIS — I255 Ischemic cardiomyopathy: Secondary | ICD-10-CM

## 2011-03-26 DIAGNOSIS — I2589 Other forms of chronic ischemic heart disease: Secondary | ICD-10-CM

## 2011-03-26 DIAGNOSIS — I5022 Chronic systolic (congestive) heart failure: Secondary | ICD-10-CM

## 2011-03-26 DIAGNOSIS — I2510 Atherosclerotic heart disease of native coronary artery without angina pectoris: Secondary | ICD-10-CM

## 2011-03-26 DIAGNOSIS — F17200 Nicotine dependence, unspecified, uncomplicated: Secondary | ICD-10-CM

## 2011-03-26 LAB — BASIC METABOLIC PANEL
Chloride: 107 mEq/L (ref 96–112)
GFR: 63.51 mL/min (ref >60.00)
Glucose, Bld: 98 mg/dL (ref 70–99)
Potassium: 4.7 mEq/L (ref 3.5–5.1)
Sodium: 141 mEq/L (ref 135–145)

## 2011-03-26 NOTE — Assessment & Plan Note (Signed)
Rate controlled. He is not on an anti-coagulant because of history of etoh abuse and need for Effient. He has now stopped drinking so we will consider after his PCI.

## 2011-03-26 NOTE — Patient Instructions (Addendum)
You are scheduled to see Dr. Clifton James again on May 06, 2011 at 10:00  Your physician has recommended you make the following change in your medication: Stop furosemide.

## 2011-03-26 NOTE — Assessment & Plan Note (Signed)
His cardiomyopathy is out of proportion to his CAD. I suspect he has mixed picture with NICM from etoh abuse in past. Will continue medical therapy and reassess LvEF after revascularization and 6 months of medical therapy. He will need an ICD in the future if his LVEF does not improve.

## 2011-03-26 NOTE — Telephone Encounter (Signed)
Effient received in office from [facility]. 120 tablets--Exp 12/13--Lot [id number]--Called pt to let him know. Left message to call back. Effient left at front desk.

## 2011-03-26 NOTE — Assessment & Plan Note (Signed)
Pt stopped smoking! 

## 2011-03-26 NOTE — Assessment & Plan Note (Signed)
Stable. He is doing well post inferior STEMI with DES RCA. He has residual disease in the RCA and Circumflex. We have discussed staged PCI of the RCA and Circumflex but he wants to wait until after Christmas. Continue current meds. I will see him back in 6 weeks and we will plan PCI then.

## 2011-03-26 NOTE — Progress Notes (Signed)
History of Present Illness: 60 y.o. WM wit history of CAD diagnosed November 2012 at time of inferior STEMI, atrial fibrillation diagnosed November 2012, cardiomyopathy felt to be secondary to etoh and possibly ischemia, tobacco abuse, etoh abuse  here today for cardiac follow up. He was admitted to [facility] November 2-8, 2012 with an an inferior STEMI. He was also noted to be in atrial fibrillation at the time of admission. Emergent LHC at [facility] 02/27/11 with occluded RCA treated with a Promus DES in the proximal RCA. He was also found to have 50% ostial diagonal stenosis, 70% proximal Circumflex stenosis, 80% mid Circumflex stenosis, 40% stenoses in both obtuse marginal branches and a 99% distal Circumflex stenosis in a small caliber portion of the vessel after the last OM branch. The LAD had minimal plaque disease. LVEF was 35% by LVgram but f/u echo showed LVEF 15%. He also has a h/o ETOH abuse and he was felt to be too high risk for coumadin due to this plus need for Effient with ACS/DES. Rate control was employed with metoprolol. Echo 02/27/11: EF 15%, mod LVE, mild to mod MR, mod BAE, mod RVE, mod decreased RVSF, PASP 48, mild to mod pulmo HTN. Biventricular failure felt to be out of proportion to CAD and suspected to have mixed ICM/NICM possibly related to ETOH. Lasix also started for systolic CHF. CHF meds were titrated. He was seen by Tereso Newcomer PA-C two weeks ago for dizziness. Hydralazine was held. He felt better after holding hydralazine.   He is here today for follow up and also to discuss staging PCI of his other vessels. He has had no chest pain or SOB. He has stopped smoking cigarettes and drinking alcohol. He tells me that he feels great. He has cut salt out of his diet. He has been eating healthy.   Past Medical History  Diagnosis Date  . CAD (coronary artery disease)     Inf STEMI 11/12: LHC at [facility] 02/27/11: oDx 50%, pCFX 70%, mCFX 80%, OM1 40%, oOM2 40%, dCFX 99% (small -  2mm), pRCA occluded, EF 35%.  He underwent PCI with Promus DES to the pRCA.  . Ischemic cardiomyopathy     Echo 02/27/11: EF 15%, mod LVE, mild to mod MR, mod BAE, mod RVE, mod decreased RVSF, PASP 48, mild to mod pulmo HTN.  Marland Kitchen Chronic systolic heart failure   . Atrial fibrillation     no coumadin due to ETOH use and need for Effient  . Alcohol abuse   . Tobacco abuse   . HLD (hyperlipidemia)   . Hemorrhoids   . HTN (hypertension)     Past Surgical History  Procedure Date  . Back surgery   . Cardiac catheterization     Current Outpatient Prescriptions  Medication Sig Dispense Refill  . aspirin EC 81 MG EC tablet Take 1 tablet (81 mg total) by mouth daily.      Marland Kitchen atorvastatin (LIPITOR) 80 MG tablet Take 1 tablet (80 mg total) by mouth at bedtime.  30 tablet  6  . furosemide (LASIX) 40 MG tablet Pt  Breaking tab in half twice to get 10 mg daily       . lisinopril (PRINIVIL,ZESTRIL) 10 MG tablet Take 1/2 tab daily       . metoprolol (LOPRESSOR) 100 MG tablet Take 1 tablet (100 mg total) by mouth 2 (two) times daily.  60 tablet  6  . nitroGLYCERIN (NITROSTAT) 0.4 MG SL tablet Place 1 tablet (  0.4 mg total) under the tongue every 5 (five) minutes x 3 doses as needed for chest pain.  25 tablet  3  . prasugrel (EFFIENT) 10 MG TABS Take 1 tablet (10 mg total) by mouth daily.  30 tablet  6  . spironolactone (ALDACTONE) 25 MG tablet Take 1 tablet (25 mg total) by mouth daily.  30 tablet  6    No Known Allergies  History   Social History  . Marital Status: Single    Spouse Name: N/A    Number of Children: N/A  . Years of Education: N/A   Occupational History  . Not on file.   Social History Main Topics  . Smoking status: Former Smoker -- 1.0 packs/day    Quit date: 02/26/2011  . Smokeless tobacco: Not on file  . Alcohol Use: Yes     daily  . Drug Use: No  . Sexually Active: Not on file   Other Topics Concern  . Not on file   Social History Narrative  . No narrative on file     Family History  Problem Relation Age of Onset  . Hypertension Mother   . Heart attack Mother 21  . Stroke Father     5 STROKES AND MI IN HIS 80'S  . Heart attack Father 55    Review of Systems:  As stated in the HPI and otherwise negative.   BP 130/86  Pulse 85  Ht 6' (1.829 m)  Wt 182 lb (82.555 kg)  BMI 24.68 kg/m2  Physical Examination: General: Well developed, well nourished, NAD HEENT: OP clear, mucus membranes moist SKIN: warm, dry. No rashes. Neuro: No focal deficits Musculoskeletal: Muscle strength 5/5 all ext Psychiatric: Mood and affect normal Neck: No JVD, no carotid bruits, no thyromegaly, no lymphadenopathy. Lungs:Clear bilaterally, no wheezes, rhonci, crackles Cardiovascular: Regular rate and rhythm. No murmurs, gallops or rubs. Abdomen:Soft. Bowel sounds present. Non-tender.  Extremities: No lower extremity edema. Pulses are 2 + in the bilateral DP/PT.  EKG: Atrial fibrillation, rate 85 bpm. LVH. Non-specific ST changes

## 2011-03-27 NOTE — Telephone Encounter (Signed)
Spoke with pt and let him know he had been accepted into Effient assistance program for one year. He will come to office to pick up samples. I told him to call us when he has about 3 weeks left so it can be reordered.

## 2011-05-06 ENCOUNTER — Encounter (HOSPITAL_COMMUNITY): Payer: Self-pay | Admitting: Pharmacy Technician

## 2011-05-06 ENCOUNTER — Ambulatory Visit (INDEPENDENT_AMBULATORY_CARE_PROVIDER_SITE_OTHER): Payer: Self-pay | Admitting: Cardiovascular Disease

## 2011-05-06 ENCOUNTER — Encounter: Payer: Self-pay | Admitting: *Deleted

## 2011-05-06 ENCOUNTER — Encounter: Payer: Self-pay | Admitting: Cardiovascular Disease

## 2011-05-06 VITALS — BP 120/88 | HR 52 | Ht 72.0 in | Wt 188.0 lb

## 2011-05-06 DIAGNOSIS — I2589 Other forms of chronic ischemic heart disease: Secondary | ICD-10-CM

## 2011-05-06 DIAGNOSIS — I255 Ischemic cardiomyopathy: Secondary | ICD-10-CM

## 2011-05-06 DIAGNOSIS — I2510 Atherosclerotic heart disease of native coronary artery without angina pectoris: Secondary | ICD-10-CM

## 2011-05-06 DIAGNOSIS — I4891 Unspecified atrial fibrillation: Secondary | ICD-10-CM

## 2011-05-06 DIAGNOSIS — I739 Peripheral vascular disease, unspecified: Secondary | ICD-10-CM

## 2011-05-06 LAB — CBC WITH DIFFERENTIAL/PLATELET
Basophils Relative: 0.6 % (ref 0.0–3.0)
Eosinophils Absolute: 0.5 10*3/uL (ref 0.0–0.7)
Hemoglobin: 15.3 g/dL (ref 13.0–17.0)
MCHC: 34.1 g/dL (ref 30.0–36.0)
MCV: 93.4 fl (ref 78.0–100.0)
Monocytes Absolute: 0.8 10*3/uL (ref 0.1–1.0)
Neutro Abs: 4.7 10*3/uL (ref 1.4–7.7)
Neutrophils Relative %: 55.3 % (ref 43.0–77.0)
RBC: 4.8 Mil/uL (ref 4.22–5.81)

## 2011-05-06 LAB — BASIC METABOLIC PANEL
CO2: 28 mEq/L (ref 19–32)
Chloride: 104 mEq/L (ref 96–112)
Creatinine, Ser: 1 mg/dL (ref 0.4–1.5)

## 2011-05-06 MED ORDER — METOPROLOL TARTRATE 100 MG PO TABS: 50.0000 mg | ORAL_TABLET | Freq: Two times a day (BID) | ORAL | Status: DC

## 2011-05-06 NOTE — Assessment & Plan Note (Signed)
Continue current meds. If no improvement in LVEF with revascularization and meds, will need consideration for ICD.

## 2011-05-06 NOTE — Progress Notes (Signed)
 History of Present Illness: 61 y.o. WM with history of CAD diagnosed November 2012 at time of inferior STEMI, atrial fibrillation diagnosed November 2012, cardiomyopathy felt to be secondary to etoh and possibly ischemia, tobacco abuse, etoh abuse here today for cardiac follow up. He was admitted to [facility] November 2-8, 2012 with an an inferior STEMI. He was also noted to be in atrial fibrillation at the time of admission. Emergent LHC at [facility] 02/27/11 with occluded RCA treated with a Promus DES in the proximal RCA. He was also found to have 50% ostial diagonal stenosis, 70% proximal Circumflex stenosis, 80% mid Circumflex stenosis, 40% stenoses in both obtuse marginal branches and a 99% distal Circumflex stenosis in a small caliber portion of the vessel after the last OM branch. The LAD had minimal plaque disease. LVEF was 35% by LVgram but f/u echo showed LVEF 15%. He also has a h/o ETOH abuse and he was felt to be too high risk for coumadin due to this plus need for Effient with ACS/DES. Rate control was employed with metoprolol. Echo 02/27/11: EF 15%, mod LVE, mild to mod MR, mod BAE, mod RVE, mod decreased RVSF, PASP 48, mild to mod pulmo HTN. Biventricular failure felt to be out of proportion to CAD and suspected to have mixed ICM/NICM possibly related to ETOH. Lasix also started for systolic CHF. CHF meds were titrated. He was seen by Scott Weaver PA-C two weeks ago for dizziness. Hydralazine was held. He felt better after holding hydralazine. I saw him 03/26/11 for f/u and we discussed staging of other vessels. He wanted to wait until after Christmas.   He is here today for follow up and also to discuss staging PCI of his other vessels. He has had no chest pain or SOB. He has stopped smoking cigarettes and drinking alcohol. He tells me that he feels great. He has cut salt out of his diet. He has been eating healthy. His BP has been low at times in the afternoon.   Past Medical History    Diagnosis Date  . CAD (coronary artery disease)     Inf STEMI 11/12: LHC at [facility] 02/27/11: oDx 50%, pCFX 70%, mCFX 80%, OM1 40%, oOM2 40%, dCFX 99% (small - 2mm), pRCA occluded, EF 35%.  He underwent PCI with Promus DES to the pRCA.  . Ischemic cardiomyopathy     Echo 02/27/11: EF 15%, mod LVE, mild to mod MR, mod BAE, mod RVE, mod decreased RVSF, PASP 48, mild to mod pulmo HTN.  . Chronic systolic heart failure   . Atrial fibrillation     no coumadin due to ETOH use and need for Effient  . Alcohol abuse   . Tobacco abuse   . HLD (hyperlipidemia)   . Hemorrhoids   . HTN (hypertension)     Past Surgical History  Procedure Date  . Back surgery   . Cardiac catheterization     Current Outpatient Prescriptions  Medication Sig Dispense Refill  . aspirin EC 81 MG EC tablet Take 1 tablet (81 mg total) by mouth daily.      . atorvastatin (LIPITOR) 80 MG tablet Take 1 tablet (80 mg total) by mouth at bedtime.  30 tablet  6  . lisinopril (PRINIVIL,ZESTRIL) 10 MG tablet Take 1/2 tab daily       . metoprolol (LOPRESSOR) 100 MG tablet Take 1 tablet (100 mg total) by mouth 2 (two) times daily.  60 tablet  6  . nitroGLYCERIN (NITROSTAT) 0.4   MG SL tablet Place 1 tablet (0.4 mg total) under the tongue every 5 (five) minutes x 3 doses as needed for chest pain.  25 tablet  3  . prasugrel (EFFIENT) 10 MG TABS Take 1 tablet (10 mg total) by mouth daily.  30 tablet  6  . spironolactone (ALDACTONE) 25 MG tablet Take 1 tablet (25 mg total) by mouth daily.  30 tablet  6    No Known Allergies  History   Social History  . Marital Status: Single    Spouse Name: N/A    Number of Children: N/A  . Years of Education: N/A   Occupational History  . Not on file.   Social History Main Topics  . Smoking status: Former Smoker -- 1.0 packs/day    Quit date: 02/26/2011  . Smokeless tobacco: Not on file  . Alcohol Use: Yes     daily  . Drug Use: No  . Sexually Active: Not on file   Other Topics Concern   . Not on file   Social History Narrative  . No narrative on file    Family History  Problem Relation Age of Onset  . Hypertension Mother   . Heart attack Mother 81  . Stroke Father     5 STROKES AND MI IN HIS 80'S  . Heart attack Father 80    Review of Systems:  As stated in the HPI and otherwise negative.   BP 120/88  Pulse 52  Ht 6' (1.829 m)  Wt 188 lb (85.276 kg)  BMI 25.50 kg/m2  Physical Examination: General: Well developed, well nourished, NAD HEENT: OP clear, mucus membranes moist SKIN: warm, dry. No rashes. Neuro: No focal deficits Musculoskeletal: Muscle strength 5/5 all ext Psychiatric: Mood and affect normal Neck: No JVD, no carotid bruits, no thyromegaly, no lymphadenopathy. Lungs:Clear bilaterally, no wheezes, rhonci, crackles Cardiovascular: Regular rate and rhythm. No murmurs, gallops or rubs. Abdomen:Soft. Bowel sounds present. Non-tender.  Extremities: No lower extremity edema. Pulses are 2 + in the bilateral DP/PT.  EKG:Atrial fibrillation, rate 83 bpm. Non-specific T wave changes inferiorly.  

## 2011-05-06 NOTE — Assessment & Plan Note (Signed)
Still in atrial fib today. Will continue rate control with beta blocker. No anti-coagulation with questionable history of etoh abuse. He has now stopped drinking etoh so will readdress issue after PCI.

## 2011-05-06 NOTE — Assessment & Plan Note (Signed)
Will get ABI. 

## 2011-05-06 NOTE — Assessment & Plan Note (Signed)
Stable. Will plan staged PCI of RCA and Circumflex next week. Continue ASA/Effient/statin. Will lower beta blocker to 50 mg po BID. Pre-cath labs today.

## 2011-05-06 NOTE — Patient Instructions (Signed)
Your physician recommends that you schedule a follow-up appointment in:3 weeks.   Your physician has requested that you have a cardiac catheterization. Cardiac catheterization is used to diagnose and/or treat various heart conditions. Doctors may recommend this procedure for a number of different reasons. The most common reason is to evaluate chest pain. Chest pain can be a symptom of coronary artery disease (CAD), and cardiac catheterization can show whether plaque is narrowing or blocking your heart's arteries. This procedure is also used to evaluate the valves, as well as measure the blood flow and oxygen levels in different parts of your heart. For further information please visit [email or web address] Please follow instruction sheet, as given. Scheduled for May 13, 2011.  Your physician has requested that you have an ankle brachial index (ABI). During this test an ultrasound and blood pressure cuff are used to evaluate the arteries that supply the arms and legs with blood. Allow thirty minutes for this exam. There are no restrictions or special instructions.   Your physician has recommended you make the following change in your medication: Decrease lopressor (metoprolol) to 50 mg twice daily.

## 2011-05-13 ENCOUNTER — Ambulatory Visit (HOSPITAL_COMMUNITY)
Admission: RE | Admit: 2011-05-13 | Discharge: 2011-05-14 | Disposition: A | Discharge status: Home / Self Care | Payer: Medicaid Other | Source: Ambulatory Visit | Attending: Cardiovascular Disease | Admitting: Cardiovascular Disease

## 2011-05-13 ENCOUNTER — Encounter (HOSPITAL_COMMUNITY): Payer: Self-pay | Admitting: General Practice

## 2011-05-13 ENCOUNTER — Other Ambulatory Visit: Payer: Self-pay

## 2011-05-13 ENCOUNTER — Encounter (HOSPITAL_COMMUNITY)
Admission: RE | Disposition: A | Discharge status: Home / Self Care | Payer: Self-pay | Source: Ambulatory Visit | Attending: Cardiovascular Disease

## 2011-05-13 DIAGNOSIS — I4891 Unspecified atrial fibrillation: Secondary | ICD-10-CM

## 2011-05-13 DIAGNOSIS — I255 Ischemic cardiomyopathy: Secondary | ICD-10-CM

## 2011-05-13 DIAGNOSIS — F17200 Nicotine dependence, unspecified, uncomplicated: Secondary | ICD-10-CM | POA: Insufficient documentation

## 2011-05-13 DIAGNOSIS — I2510 Atherosclerotic heart disease of native coronary artery without angina pectoris: Secondary | ICD-10-CM

## 2011-05-13 DIAGNOSIS — Z9861 Coronary angioplasty status: Secondary | ICD-10-CM | POA: Insufficient documentation

## 2011-05-13 DIAGNOSIS — I252 Old myocardial infarction: Secondary | ICD-10-CM | POA: Insufficient documentation

## 2011-05-13 DIAGNOSIS — I5022 Chronic systolic (congestive) heart failure: Secondary | ICD-10-CM | POA: Insufficient documentation

## 2011-05-13 DIAGNOSIS — F1010 Alcohol abuse, uncomplicated: Secondary | ICD-10-CM | POA: Insufficient documentation

## 2011-05-13 DIAGNOSIS — I10 Essential (primary) hypertension: Secondary | ICD-10-CM | POA: Insufficient documentation

## 2011-05-13 HISTORY — DX: Low back pain, unspecified: M54.50

## 2011-05-13 HISTORY — DX: Low back pain: M54.5

## 2011-05-13 HISTORY — PX: PERCUTANEOUS CORONARY STENT INTERVENTION (PCI-S): SHX5485

## 2011-05-13 HISTORY — DX: Other chronic pain: G89.29

## 2011-05-13 HISTORY — PX: CORONARY ANGIOPLASTY WITH STENT PLACEMENT: SHX49

## 2011-05-13 LAB — POCT ACTIVATED CLOTTING TIME: Activated Clotting Time: 590 seconds

## 2011-05-13 SURGERY — PERCUTANEOUS CORONARY STENT INTERVENTION (PCI-S)
Anesthesia: LOCAL

## 2011-05-13 MED ORDER — SPIRONOLACTONE 25 MG PO TABS
25.0000 mg | ORAL_TABLET | Freq: Every day | ORAL | Status: DC
  Administered 2011-05-14: 25 mg via ORAL
  Filled 2011-05-13 (×2): qty 1

## 2011-05-13 MED ORDER — DIAZEPAM 5 MG PO TABS
5.0000 mg | ORAL_TABLET | ORAL | Status: AC
  Administered 2011-05-13: 5 mg via ORAL
  Filled 2011-05-13: qty 1

## 2011-05-13 MED ORDER — ASPIRIN EC 81 MG PO TBEC
81.0000 mg | DELAYED_RELEASE_TABLET | Freq: Every day | ORAL | Status: DC
  Administered 2011-05-14: 81 mg via ORAL
  Filled 2011-05-13 (×2): qty 1

## 2011-05-13 MED ORDER — SODIUM CHLORIDE 0.9 % IV SOLN: 250.0000 mL | INTRAVENOUS | Status: DC | PRN

## 2011-05-13 MED ORDER — VERAPAMIL HCL 2.5 MG/ML IV SOLN
INTRAVENOUS | Status: AC
  Filled 2011-05-13: qty 2

## 2011-05-13 MED ORDER — SODIUM CHLORIDE 0.9 % IJ SOLN: 3.0000 mL | INTRAMUSCULAR | Status: DC | PRN

## 2011-05-13 MED ORDER — SODIUM CHLORIDE 0.9 % IV SOLN: INTRAVENOUS | Status: AC

## 2011-05-13 MED ORDER — FENTANYL CITRATE 0.05 MG/ML IJ SOLN
INTRAMUSCULAR | Status: AC
  Filled 2011-05-13: qty 2

## 2011-05-13 MED ORDER — ROSUVASTATIN CALCIUM 40 MG PO TABS
40.0000 mg | ORAL_TABLET | Freq: Every day | ORAL | Status: DC
  Filled 2011-05-13 (×3): qty 1

## 2011-05-13 MED ORDER — METOPROLOL TARTRATE 50 MG PO TABS
50.0000 mg | ORAL_TABLET | Freq: Two times a day (BID) | ORAL | Status: DC
  Administered 2011-05-13 – 2011-05-14 (×2): 50 mg via ORAL
  Filled 2011-05-13 (×4): qty 1

## 2011-05-13 MED ORDER — ONDANSETRON HCL 4 MG/2ML IJ SOLN: 4.0000 mg | Freq: Four times a day (QID) | INTRAMUSCULAR | Status: DC | PRN

## 2011-05-13 MED ORDER — LISINOPRIL 5 MG PO TABS
5.0000 mg | ORAL_TABLET | Freq: Every day | ORAL | Status: DC
  Administered 2011-05-14: 5 mg via ORAL
  Filled 2011-05-13 (×2): qty 1

## 2011-05-13 MED ORDER — BIVALIRUDIN 250 MG IV SOLR
INTRAVENOUS | Status: AC
  Filled 2011-05-13: qty 250

## 2011-05-13 MED ORDER — NITROGLYCERIN 0.4 MG SL SUBL: 0.4000 mg | SUBLINGUAL_TABLET | SUBLINGUAL | Status: DC | PRN

## 2011-05-13 MED ORDER — LIDOCAINE HCL (PF) 1 % IJ SOLN
INTRAMUSCULAR | Status: AC
  Filled 2011-05-13: qty 30

## 2011-05-13 MED ORDER — ASPIRIN 81 MG PO CHEW
324.0000 mg | CHEWABLE_TABLET | ORAL | Status: AC
  Administered 2011-05-13: 324 mg via ORAL

## 2011-05-13 MED ORDER — MIDAZOLAM HCL 2 MG/2ML IJ SOLN
INTRAMUSCULAR | Status: AC
  Filled 2011-05-13: qty 2

## 2011-05-13 MED ORDER — PRASUGREL HCL 10 MG PO TABS
10.0000 mg | ORAL_TABLET | Freq: Every day | ORAL | Status: DC
  Administered 2011-05-14: 10 mg via ORAL
  Filled 2011-05-13 (×2): qty 1

## 2011-05-13 MED ORDER — ASPIRIN 81 MG PO CHEW
CHEWABLE_TABLET | ORAL | Status: AC
  Administered 2011-05-13: 324 mg via ORAL
  Filled 2011-05-13: qty 4

## 2011-05-13 MED ORDER — ACETAMINOPHEN 325 MG PO TABS: 650.0000 mg | ORAL_TABLET | ORAL | Status: DC | PRN

## 2011-05-13 MED ORDER — HEPARIN (PORCINE) IN NACL 2-0.9 UNIT/ML-% IJ SOLN
INTRAMUSCULAR | Status: AC
  Filled 2011-05-13: qty 2000

## 2011-05-13 MED ORDER — SODIUM CHLORIDE 0.9 % IJ SOLN: 3.0000 mL | Freq: Two times a day (BID) | INTRAMUSCULAR | Status: DC

## 2011-05-13 MED ORDER — NITROGLYCERIN 0.2 MG/ML ON CALL CATH LAB
INTRAVENOUS | Status: AC
  Filled 2011-05-13: qty 1

## 2011-05-13 MED ORDER — SODIUM CHLORIDE 0.9 % IV SOLN
INTRAVENOUS | Status: DC
  Administered 2011-05-13: 1000 mL via INTRAVENOUS

## 2011-05-13 NOTE — H&P (View-Only) (Signed)
History of Present Illness: 61 y.o. WM with history of CAD diagnosed November 2012 at time of inferior STEMI, atrial fibrillation diagnosed November 2012, cardiomyopathy felt to be secondary to etoh and possibly ischemia, tobacco abuse, etoh abuse here today for cardiac follow up. He was admitted to [facility] November 2-8, 2012 with an an inferior STEMI. He was also noted to be in atrial fibrillation at the time of admission. Emergent LHC at [facility] 02/27/11 with occluded RCA treated with a Promus DES in the proximal RCA. He was also found to have 50% ostial diagonal stenosis, 70% proximal Circumflex stenosis, 80% mid Circumflex stenosis, 40% stenoses in both obtuse marginal branches and a 99% distal Circumflex stenosis in a small caliber portion of the vessel after the last OM branch. The LAD had minimal plaque disease. LVEF was 35% by LVgram but f/u echo showed LVEF 15%. He also has a h/o ETOH abuse and he was felt to be too high risk for coumadin due to this plus need for Effient with ACS/DES. Rate control was employed with metoprolol. Echo 02/27/11: EF 15%, mod LVE, mild to mod MR, mod BAE, mod RVE, mod decreased RVSF, PASP 48, mild to mod pulmo HTN. Biventricular failure felt to be out of proportion to CAD and suspected to have mixed ICM/NICM possibly related to ETOH. Lasix also started for systolic CHF. CHF meds were titrated. He was seen by Tereso Newcomer PA-C two weeks ago for dizziness. Hydralazine was held. He felt better after holding hydralazine. I saw him 03/26/11 for f/u and we discussed staging of other vessels. He wanted to wait until after Christmas.   He is here today for follow up and also to discuss staging PCI of his other vessels. He has had no chest pain or SOB. He has stopped smoking cigarettes and drinking alcohol. He tells me that he feels great. He has cut salt out of his diet. He has been eating healthy. His BP has been low at times in the afternoon.   Past Medical History    Diagnosis Date  . CAD (coronary artery disease)     Inf STEMI 11/12: LHC at [facility] 02/27/11: oDx 50%, pCFX 70%, mCFX 80%, OM1 40%, oOM2 40%, dCFX 99% (small - 2mm), pRCA occluded, EF 35%.  He underwent PCI with Promus DES to the pRCA.  . Ischemic cardiomyopathy     Echo 02/27/11: EF 15%, mod LVE, mild to mod MR, mod BAE, mod RVE, mod decreased RVSF, PASP 48, mild to mod pulmo HTN.  Marland Kitchen Chronic systolic heart failure   . Atrial fibrillation     no coumadin due to ETOH use and need for Effient  . Alcohol abuse   . Tobacco abuse   . HLD (hyperlipidemia)   . Hemorrhoids   . HTN (hypertension)     Past Surgical History  Procedure Date  . Back surgery   . Cardiac catheterization     Current Outpatient Prescriptions  Medication Sig Dispense Refill  . aspirin EC 81 MG EC tablet Take 1 tablet (81 mg total) by mouth daily.      Marland Kitchen atorvastatin (LIPITOR) 80 MG tablet Take 1 tablet (80 mg total) by mouth at bedtime.  30 tablet  6  . lisinopril (PRINIVIL,ZESTRIL) 10 MG tablet Take 1/2 tab daily       . metoprolol (LOPRESSOR) 100 MG tablet Take 1 tablet (100 mg total) by mouth 2 (two) times daily.  60 tablet  6  . nitroGLYCERIN (NITROSTAT) 0.4  MG SL tablet Place 1 tablet (0.4 mg total) under the tongue every 5 (five) minutes x 3 doses as needed for chest pain.  25 tablet  3  . prasugrel (EFFIENT) 10 MG TABS Take 1 tablet (10 mg total) by mouth daily.  30 tablet  6  . spironolactone (ALDACTONE) 25 MG tablet Take 1 tablet (25 mg total) by mouth daily.  30 tablet  6    No Known Allergies  History   Social History  . Marital Status: Single    Spouse Name: N/A    Number of Children: N/A  . Years of Education: N/A   Occupational History  . Not on file.   Social History Main Topics  . Smoking status: Former Smoker -- 1.0 packs/day    Quit date: 02/26/2011  . Smokeless tobacco: Not on file  . Alcohol Use: Yes     daily  . Drug Use: No  . Sexually Active: Not on file   Other Topics Concern   . Not on file   Social History Narrative  . No narrative on file    Family History  Problem Relation Age of Onset  . Hypertension Mother   . Heart attack Mother 58  . Stroke Father     5 STROKES AND MI IN HIS 80'S  . Heart attack Father 25    Review of Systems:  As stated in the HPI and otherwise negative.   BP 120/88  Pulse 52  Ht 6' (1.829 m)  Wt 188 lb (85.276 kg)  BMI 25.50 kg/m2  Physical Examination: General: Well developed, well nourished, NAD HEENT: OP clear, mucus membranes moist SKIN: warm, dry. No rashes. Neuro: No focal deficits Musculoskeletal: Muscle strength 5/5 all ext Psychiatric: Mood and affect normal Neck: No JVD, no carotid bruits, no thyromegaly, no lymphadenopathy. Lungs:Clear bilaterally, no wheezes, rhonci, crackles Cardiovascular: Regular rate and rhythm. No murmurs, gallops or rubs. Abdomen:Soft. Bowel sounds present. Non-tender.  Extremities: No lower extremity edema. Pulses are 2 + in the bilateral DP/PT.  ZOX:WRUEAV fibrillation, rate 83 bpm. Non-specific T wave changes inferiorly.

## 2011-05-13 NOTE — Op Note (Signed)
Cardiac Catheterization Operative Report  Andres Ward [id number] 1/16/20139:40 AM No primary provider on file.  Procedure Performed:  1. PTCA/DES x 1 mid Circumflex 2. PTCA/DES x 1 proximal Circumflex 3. PTCA/DES x 1 distal RCA  Operator: Verne Carrow, MD  Access: Right radial artery.  Indication: CAD, staged PCI after STEMI 02/27/11 with occluded RCA                                       Procedure Details: The risks, benefits, complications, treatment options, and expected outcomes were discussed with the patient. The patient and/or family concurred with the proposed plan, giving informed consent. The patient was brought to the cath lab after IV hydration was begun and oral premedication was given. The patient was further sedated with Versed and Fentanyl. The right wrist was assessed with an Allens test which was positive. The right wrist was prepped and draped in the usual manner. Using the modified Seldinger access technique, a 6 French sheath was placed in the right radial artery. 3 mg Verapamil was given through the sheath. An Angiomax bolus was given and a drip was started. When the ACT was greater than 200, a XB LAD 3.5 guiding catheter was used to engage the left main artery. A Cougar IC wire was advanced down the Circumflex artery. A 2.75 x 12 mm Promus Element DES was placed in the mid vessel. A 2.75 x 8 mm [place] balloon was used to post-dilate the stenosis.  A 3.5 x 16 mm Promus Element DES was placed in the proximal vessel. A 4.0 x 12 mm [place] balloon was used to post-dilate the stent. I then removed the guide and a JR4 guide was used to engage the native RCA. A Cougar IC wire was advanced down the RCA. A 3.5 x 12 mm Promus Element DES was deployed in the distal RCA. A 3.75 x 8 mm [place] balloon was used to post-dilate the stent. There was an excellent angiographic result in both vessels with some residual moderate disease in the distal circumflex, proximal RCA and distal RCA. The  sheath was removed from the right radial artery and a Terumo hemostasis band was applied to the arteriotomy site. Angiomax was stopped in the cath lab.   There were no immediate complications. The patient was taken to the recovery area in stable condition.    Hemodynamic Findings: Central aortic pressure: 120/78  Impression: 1. Successful PTCA/DES x 2 in the Circumflex artery (2 different lesions, non-overlapping) 2. Successful PTCA/DES x 1 RCA   Recommendations: Continue current therapy. Dual antiplatelet therapy for at least one more year. Reassess LVEF in 3 months.        Complications:  None; patient tolerated the procedure well.

## 2011-05-13 NOTE — Progress Notes (Signed)
TR BAND REMOVAL  LOCATION:    right radial  DEFLATED PER PROTOCOL:    yes  TIME BAND OFF / DRESSING APPLIED:    1315   SITE UPON ARRIVAL:    Level 0  SITE AFTER BAND REMOVAL:    Level 0  REVERSE ALLEN'S TEST:     positive  CIRCULATION SENSATION AND MOVEMENT:    Within Normal Limits   yes  COMMENTS:   Tolerated procedure well

## 2011-05-13 NOTE — Interval H&P Note (Signed)
History and Physical Interval Note:  05/13/2011 8:21 AM  The patient is here today for a left heart catheterization with PCI. I have personally reviewed the labs and examined the patient. The History and Physical has been reviewed and there are no changes. The risks and benefits of the procedure have been reviewed with the patient. Informed consent has been signed by the patient and is present in the chart. The patient agrees to proceed with the procedure.    MCALHANY,CHRISTOPHER

## 2011-05-14 ENCOUNTER — Ambulatory Visit: Payer: Self-pay | Admitting: Cardiovascular Disease

## 2011-05-14 ENCOUNTER — Encounter (HOSPITAL_COMMUNITY): Payer: Self-pay | Admitting: Cardiology

## 2011-05-14 ENCOUNTER — Other Ambulatory Visit: Payer: Self-pay

## 2011-05-14 DIAGNOSIS — I4891 Unspecified atrial fibrillation: Secondary | ICD-10-CM

## 2011-05-14 DIAGNOSIS — I2510 Atherosclerotic heart disease of native coronary artery without angina pectoris: Secondary | ICD-10-CM

## 2011-05-14 LAB — CBC
MCH: 31 pg (ref 26.0–34.0)
MCHC: 34.4 g/dL (ref 30.0–36.0)
MCV: 90.1 fL (ref 78.0–100.0)
Platelets: 232 10*3/uL (ref 150–400)
RDW: 13 % (ref 11.5–15.5)

## 2011-05-14 LAB — BASIC METABOLIC PANEL
Calcium: 8.5 mg/dL (ref 8.4–10.5)
Creatinine, Ser: 0.98 mg/dL (ref 0.50–1.35)
GFR calc Af Amer: >90 mL/min (ref >90)
GFR calc non Af Amer: 89 mL/min — ABNORMAL LOW (ref >90)
Sodium: 138 mEq/L (ref 135–145)

## 2011-05-14 MED FILL — Dextrose Inj 5%: INTRAVENOUS | Qty: 50 | Status: AC

## 2011-05-14 NOTE — Discharge Summary (Signed)
Full note written this am. cdm 

## 2011-05-14 NOTE — Progress Notes (Addendum)
Clinical Social Worker received referral for advanced directive request. CSW provided patient with advance directive packet and a MOST form to be completed with his physician. CSW answered patient's questions and informed patient where the forms can get notarized outside the hospital. CSW will sign off as social work intervention is no longer needed. Please consult [place] again if new needs arise.  Rozetta Nunnery MSW, [employer] [phone]

## 2011-05-14 NOTE — Discharge Summary (Signed)
Discharge Summary   Patient ID: Gio Janoski MRN: [id number], DOB/AGE: 05-03-1950 61 y.o.  Primary MD: No primary provider on file. Primary Cardiologist: Verne Carrow MD  Admit date: 05/13/2011 D/C date:     05/14/2011      Primary Discharge Diagnoses:  1. CAD  - s/p DES x2 to proximal and mid LCx (2 different lesions, non-overlapping) and DES x1 to distal RCA on 05/13/11  - Inferior STEMI s/p DES to prox RCA Nov '12   2. Chronic Systolic CHF 2/2 Mixed Ischemic/NonIschemic Cardiomyopathy  - EF 35% by cath, 15% by echo Nov '12  - Continued BB, Aldactone, ACE  - Follow up echo in 3mos, for consideration of ICD if LV dysfunction persists  Secondary Discharge Diagnoses:  1. Chronic Atrial Fibrillation  - Not previously on coumadin 2/2 ETOH abuse (Pt states he has quit, Dr. Clifton James to discuss possible initiation of anticoagulation/DCCV at next office visit) 2. Tobacco Abuse 3. H/o ETOH Abuse 4. HLD 5. HTN 6. Hemorrhoids  Allergies No Known Allergies  Diagnostic Studies/Procedures:  05/13/11 - Left Heart Catheterization and PCI Impression:  1. Successful PTCA/DES x 2 in the Circumflex artery (2 different lesions, non-overlapping)  2. Successful PTCA/DES x 1 RCA  Recommendations: Continue current therapy. Dual antiplatelet therapy for at least one more year. Reassess LVEF in 3 months.    History of Present Illness: 61 y.o. male w/ PMHx significant for CAD, HTN, HLD, Chronic A. Fib (not on coumadin), Chronic Systolic CHF 2/2 Mixed CM (EF 40% by cath, 15% by echo Nov '12), Tobacco and ETOH abuse who was admitted to [facility] on 05/13/11 for planned PCI.  He was admitted to [facility] February 27, 2011 with an an inferior STEMI and underwent PCI to RCA. He had residual Circumflex disease that was to be addressed at a later date. He was also noted to be in atrial fibrillation at the time of admission but due to ETOH abuse he was felt to be too high risk  for coumadin, plus his need for Effient with ACS/DES. Rate control was employed with metoprolol. Echo 02/27/11: EF 15%, mod LVE, mild to mod MR, mod BAE, mod RVE, mod decreased RVSF, PASP 48, mild to mod pulmo HTN. Biventricular failure felt to be out of proportion to CAD and suspected to have mixed ICM/NICM possibly related to ETOH. Lasix also started for systolic CHF. He was seen in clinic on 03/26/11 for f/u and discussed staging of other vessels for which he wanted to wait until after [facility] Course: He was admitted to [facility] on 05/13/11 for planned PCI. Left heart catheterization was performed on 05/13/11 with findings noted as above with PTCA/DES x2 to mid and prox LCX (2 different lesions, non-overlapping) and PTCA/DES x 1 distal RCA. He tolerated the procedure well without complications. Recommendations by Dr. Clifton James were for dual antiplatelet therapy with ASA and Effient for at least one more year. His EF was 35% by cath and 15% by echo in Nov 12 and will require 3 months of medical therapy with follow up echo at which time it will be determined if he needs ICD implantation. The patient reported quitting alcohol and will therefore have discussions concerning initiation of anticoagulation and DCCV during his next office visit.  He was chest pain free and able to ambulate with cardiac rehab. He was seen and examined by Dr. Clifton James and felt stable for discharge to home with plans for follow up as scheduled below.  Discharge Vitals: Blood pressure 103/70, pulse 76, temperature 97.9 F (36.6 C), temperature source Oral, resp. rate 19, height 6' (1.829 m), weight 189 lb (85.73 kg), SpO2 93.00%.  Labs: Component Value Date   WBC 8.5 05/14/2011   HGB 13.8 05/14/2011   HCT 40.1 05/14/2011   MCV 90.1 05/14/2011   PLT 232 05/14/2011    Lab 05/14/11 0400  NA 138  K 4.6  CL 105  CO2 27  BUN 13  CREATININE 0.98  CALCIUM 8.5  GLUCOSE 94    Discharge Medications   Current Discharge  Medication List    CONTINUE these medications which have NOT CHANGED   Details  aspirin EC 81 MG EC tablet Take 1 tablet (81 mg total) by mouth daily.    atorvastatin (LIPITOR) 80 MG tablet Take 1 tablet (80 mg total) by mouth at bedtime. Qty: 30 tablet, Refills: 6    lisinopril (PRINIVIL,ZESTRIL) 10 MG tablet Take 1/2 tab daily     metoprolol (LOPRESSOR) 100 MG tablet Take 0.5 tablets (50 mg total) by mouth 2 (two) times daily. Qty: 30 tablet, Refills: 6   Associated Diagnoses: CAD (coronary artery disease)    prasugrel (EFFIENT) 10 MG TABS Take 1 tablet (10 mg total) by mouth daily. Qty: 30 tablet, Refills: 6    spironolactone (ALDACTONE) 25 MG tablet Take 1 tablet (25 mg total) by mouth daily. Qty: 30 tablet, Refills: 6    nitroGLYCERIN (NITROSTAT) 0.4 MG SL tablet Place 1 tablet (0.4 mg total) under the tongue every 5 (five) minutes x 3 doses as needed for chest pain. Qty: 25 tablet, Refills: 3        Disposition   Discharge Orders    Future Appointments: Provider: Department: Dept Phone: Center:   05/22/2011 10:30 AM [place]. [facility]  None   05/29/2011 10:00 AM Verne Carrow, MD [facility] [phone] [facility]     Future Orders Please Complete By Expires   Diet - low sodium heart healthy      Increase activity slowly      Discharge instructions      Comments:   **PLEASE REMEMBER TO BRING ALL OF YOUR MEDICATIONS TO EACH OF YOUR FOLLOW-UP OFFICE VISITS.  * KEEP WRIST CATHETERIZATION SITE CLEAN AND DRY. Call the office for any signs of bleeding, pus, swelling, increased pain, or any other concerns. * NO HEAVY LIFTING (>10lbs) OR SEXUAL ACTIVITY X 7 DAYS. * NO DRIVING X 2-3 DAYS. * NO SOAKING BATHS, HOT TUBS, POOLS, ETC., X 7 DAYS.      Follow-up Information    Follow up with Verne Carrow, MD on 05/29/2011. (10:00am)    Contact information:   [facility] [address] [place]  [address] [phone]       Follow up with [facility]. (Our office will call you with follow up echocardiogram appointment)    Contact information:   [address] [place] [address]           Outstanding Labs/Studies:  1. Follow up echo as outpatient in 3mos  Duration of Discharge Encounter: Greater than 30 minutes including physician and PA time.  Signed, Mckinze Poirier PA-C 05/14/2011, 7:38 AM

## 2011-05-14 NOTE — Progress Notes (Signed)
SUBJECTIVE: No events overnight. No chest pain or SOB.   BP 117/82  Pulse 71  Temp(Src) 98 F (36.7 C) (Oral)  Resp 18  Ht 6' (1.829 m)  Wt 189 lb (85.73 kg)  BMI 25.63 kg/m2  SpO2 96%  Intake/Output Summary (Last 24 hours) at 05/14/11 1191 Last data filed at 05/13/11 1820  Gross per 24 hour  Intake 1894.17 ml  Output   1000 ml  Net 894.17 ml    PHYSICAL EXAM General: Well developed, well nourished, in no acute distress. Alert and oriented x 3.  Psych:  Good affect, responds appropriately Neck: No JVD. No masses noted.  Lungs: Clear bilaterally with no wheezes or rhonci noted.  Heart: RRR with no murmurs noted. Abdomen: Bowel sounds are present. Soft, non-tender.  Extremities: No lower extremity edema. Right radial cath site ok.   LABS: Basic Metabolic Panel:  Basename 05/14/11 0400  NA 138  K 4.6  CL 105  CO2 27  GLUCOSE 94  BUN 13  CREATININE 0.98  CALCIUM 8.5  MG --  PHOS --   CBC:  Basename 05/14/11 0400  WBC 8.5  NEUTROABS --  HGB 13.8  HCT 40.1  MCV 90.1  PLT 232   Cardiac Enzymes: No results found for this basename: CKTOTAL:3,CKMB:3,CKMBINDEX:3,TROPONINI:3 in the last 72 hours Fasting Lipid Panel: No results found for this basename: CHOL,HDL,LDLCALC,TRIG,CHOLHDL,LDLDIRECT in the last 72 hours  Current Meds:    . aspirin  324 mg Oral Pre-Cath  . aspirin EC  81 mg Oral Daily  . bivalirudin      . diazepam  5 mg Oral On Call  . fentaNYL      . heparin      . lidocaine      . lisinopril  5 mg Oral Daily  . metoprolol  50 mg Oral BID  . midazolam      . nitroGLYCERIN      . prasugrel  10 mg Oral Daily  . rosuvastatin  40 mg Oral q1800  . spironolactone  25 mg Oral Daily  . verapamil      . DISCONTD: sodium chloride  3 mL Intravenous Q12H     ASSESSMENT AND PLAN:  1. CAD: Admitted yesterday after planned staged PCI of the Circumflex and RCA. Pt had been admitted November 2012 with acute inferior STEMI and had a DES placed in the mid  RCA at that time. Yesterday I placed 2 drug eluting stents in the proximal and mid Circumflex and one in the distal RCA. He has done done well. Continue home meds which include ASA, Effient, statin, beta blocker, aldactone and Ace-inh.  2. Chronic atrial fibrillation: We have not started coumadin given his history of etoh abuse and need for more coronary interventions. He tells me that he has stopped drinking etoh. Will discuss initiating anticoagulation at f/u visit in 1-2 weeks. May attempt cardioversion in the future.   3. Ischemic cardiomyopathy: Continue medical management. Will repeat echo in 3 months. If no recovery of LV function, he will need to be considered for an ICD.   4. Disposition: d/c home today. F/U with me as planned in several weeks.   MCALHANY,CHRISTOPHER  1/17/20136:28 AM

## 2011-05-14 NOTE — Consult Note (Signed)
Pt quit smoking back in 12/2010 and has remained tobacco free. Congratulated and encouraged pt to remain tobacco free. Discussed relapse prevention strategies and referred to 1-800-quit now. Reviewed and gave pt education/contact information.

## 2011-05-14 NOTE — Progress Notes (Signed)
   CARE MANAGEMENT NOTE 05/14/2011  Patient:  Andres Ward, Andres Ward   Account Number:  [id number]  Date Initiated:  05/14/2011  Documentation initiated by:  GRAVES-BIGELOW,Luretha Eberly  Subjective/Objective Assessment:   Pt in for LHC. Plan home on effient. Pt stated to RN JIm that he had a year supply of efient and did not need any assistance. Pt was d/c prior to CM receiving referral.     Action/Plan:   Anticipated DC Date:  05/14/2011   Anticipated DC Plan:  HOME/SELF CARE         Choice offered to / List presented to:             Status of service:  Completed, signed off Medicare Important Message given?   (If response is "NO", the following Medicare IM given date fields will be blank) Date Medicare IM given:   Date Additional Medicare IM given:    Discharge Disposition:  HOME/SELF CARE  Per UR Regulation:    Comments:

## 2011-05-14 NOTE — Progress Notes (Signed)
CARDIAC REHAB PHASE I   PRE:  Rate/Rhythm: 77 A fib    BP: sitting 118/86    SaO2:   MODE:  Ambulation: 680 ft   POST:  Rate/Rhythm: 100 A fib    BP: sitting 111/88     SaO2:   Tolerated well, feels great. Reviewed ed. Pt sts it has been easy to be compliant. Sts he chops wood daily without any problems. Unable to do CRPII due to finances and distance.  [id number]  Harriet Masson CES, ACSM

## 2011-05-21 ENCOUNTER — Telehealth: Payer: Self-pay | Admitting: Cardiovascular Disease

## 2011-05-21 NOTE — Telephone Encounter (Signed)
I spoke with pt and told him phone call was to remind him of appt tomorrow for ABI's.  I did not see any other phone calls made to him.

## 2011-05-21 NOTE — Telephone Encounter (Signed)
FU Call: Pt returning call from our office. Please return pt call to discuss further.  

## 2011-05-22 ENCOUNTER — Encounter (INDEPENDENT_AMBULATORY_CARE_PROVIDER_SITE_OTHER): Payer: Self-pay | Admitting: *Deleted

## 2011-05-22 DIAGNOSIS — I739 Peripheral vascular disease, unspecified: Secondary | ICD-10-CM

## 2011-05-29 ENCOUNTER — Ambulatory Visit (INDEPENDENT_AMBULATORY_CARE_PROVIDER_SITE_OTHER): Payer: Self-pay | Admitting: Cardiovascular Disease

## 2011-05-29 ENCOUNTER — Encounter: Payer: Self-pay | Admitting: Cardiovascular Disease

## 2011-05-29 VITALS — BP 118/74 | HR 85 | Ht 72.0 in | Wt 187.0 lb

## 2011-05-29 DIAGNOSIS — I2589 Other forms of chronic ischemic heart disease: Secondary | ICD-10-CM

## 2011-05-29 DIAGNOSIS — I2510 Atherosclerotic heart disease of native coronary artery without angina pectoris: Secondary | ICD-10-CM

## 2011-05-29 DIAGNOSIS — I4891 Unspecified atrial fibrillation: Secondary | ICD-10-CM

## 2011-05-29 DIAGNOSIS — I255 Ischemic cardiomyopathy: Secondary | ICD-10-CM

## 2011-05-29 MED ORDER — WARFARIN SODIUM 5 MG PO TABS: 5.0000 mg | ORAL_TABLET | Freq: Every day | ORAL | Status: DC

## 2011-05-29 NOTE — Assessment & Plan Note (Signed)
Rate controlled. Will start coumadin and see back in one month. If still in atrial fib, will plan DCCV. He cannot afford Xarelto. Could consider applying for assistance program.

## 2011-05-29 NOTE — Assessment & Plan Note (Signed)
Repeat echo in 3 months post revascularization and if LVEF less than 35%, will need ICD.

## 2011-05-29 NOTE — Progress Notes (Signed)
History of Present Illness: 61 y.o. WM with history of CAD diagnosed November 2012 at time of inferior STEMI, atrial fibrillation diagnosed November 2012, cardiomyopathy felt to be secondary to etoh and possibly ischemia, tobacco abuse, etoh abuse here today for cardiac follow up. He was admitted to [facility] November 2-8, 2012 with an an inferior STEMI. He was also noted to be in atrial fibrillation at the time of admission. Emergent LHC at [facility] 02/27/11 with occluded RCA treated with a Promus DES in the proximal RCA. He was also found to have 50% ostial diagonal stenosis, 70% proximal Circumflex stenosis, 80% mid Circumflex stenosis, 40% stenoses in both obtuse marginal branches and a 99% distal Circumflex stenosis in a small caliber portion of the vessel after the last OM branch. The LAD had minimal plaque disease. LVEF was 35% by LVgram but f/u echo showed LVEF 15%. He also has a h/o ETOH abuse and he was felt to be too high risk for coumadin due to this plus need for Effient with ACS/DES. Rate control was employed with metoprolol. Echo 02/27/11: EF 15%, mod LVE, mild to mod MR, mod BAE, mod RVE, mod decreased RVSF, PASP 48, mild to mod pulmo HTN. Biventricular failure felt to be out of proportion to CAD and suspected to have mixed ICM/NICM possibly related to ETOH. Lasix also started for systolic CHF. CHF meds were titrated.  I saw him 03/26/11 for f/u and we discussed staging of other vessels. He wanted to wait until after Christmas. I brought him in January 16,2013 and placed drug eluting stents in the proximal and mid Circumflex and in the distal RCA.   He is here today for follow up. He has had some dizziness in the afternoon. He has noticed his heart skipping some beats. He has had no chest pain or SOB. He has stopped smoking cigarettes and drinking alcohol.  He has been eating healthy.   Past Medical History  Diagnosis Date  . CAD (coronary artery disease)     Inf STEMI 11/12: LHC at  [facility] 02/27/11: oDx 50%, pCFX 70%, mCFX 80%, OM1 40%, oOM2 40%, dCFX 99% (small - 2mm), pRCA occluded, EF 35%.  He underwent PCI with Promus DES to the pRCA. s/p planned PCI w/ DES to prox Lcx, DES to mid LCx, DES to distal RCA 05/13/11.  . Ischemic cardiomyopathy     Echo 02/27/11: EF 15%, mod LVE, mild to mod MR, mod BAE, mod RVE, mod decreased RVSF, PASP 48, mild to mod pulmo HTN.  Marland Kitchen Chronic systolic heart failure   . Atrial fibrillation     no coumadin due to ETOH use and need for Effient  . Alcohol abuse   . Tobacco abuse   . HLD (hyperlipidemia)   . Hemorrhoids   . HTN (hypertension)   . Chronic lower back pain     "sciatic nerve"    Past Surgical History  Procedure Date  . Back surgery ~ 1987    L4-5 ruptured disc  . Coronary angioplasty with stent placement 02/27/11    "1"  . Coronary angioplasty with stent placement 05/13/11    "3"    Current Outpatient Prescriptions  Medication Sig Dispense Refill  . aspirin EC 81 MG EC tablet Take 1 tablet (81 mg total) by mouth daily.      Marland Kitchen atorvastatin (LIPITOR) 80 MG tablet Take 1 tablet (80 mg total) by mouth at bedtime.  30 tablet  6  . lisinopril (PRINIVIL,ZESTRIL) 10 MG tablet  Take 1/2 tab daily       . metoprolol (LOPRESSOR) 100 MG tablet Take 0.5 tablets (50 mg total) by mouth 2 (two) times daily.  30 tablet  6  . nitroGLYCERIN (NITROSTAT) 0.4 MG SL tablet Place 1 tablet (0.4 mg total) under the tongue every 5 (five) minutes x 3 doses as needed for chest pain.  25 tablet  3  . prasugrel (EFFIENT) 10 MG TABS Take 1 tablet (10 mg total) by mouth daily.  30 tablet  6  . spironolactone (ALDACTONE) 25 MG tablet Take 1 tablet (25 mg total) by mouth daily.  30 tablet  6    No Known Allergies  History   Social History  . Marital Status: Single    Spouse Name: N/A    Number of Children: N/A  . Years of Education: N/A   Occupational History  . Not on file.   Social History Main Topics  . Smoking status: Former Smoker -- 1.0  packs/day for 44 years    Types: Cigarettes    Quit date: 02/27/2011  . Smokeless tobacco: Former Neurosurgeon    Types: Chew    Quit date: 02/27/2011  . Alcohol Use: Yes     "stopped drinking alcohol 02/27/11; had been drinking 1 mixed drink qd 365 days/yr""  . Drug Use: Yes    Special: Marijuana     "used pot ages [age]"  . Sexually Active: Yes   Other Topics Concern  . Not on file   Social History Narrative  . No narrative on file    Family History  Problem Relation Age of Onset  . Hypertension Mother   . Heart attack Mother 64  . Stroke Father     5 STROKES AND MI IN HIS 80'S  . Heart attack Father 52    Review of Systems:  As stated in the HPI and otherwise negative.   BP 118/74  Pulse 85  Ht 6' (1.829 m)  Wt 187 lb (84.823 kg)  BMI 25.36 kg/m2  Physical Examination: General: Well developed, well nourished, NAD HEENT: OP clear, mucus membranes moist SKIN: warm, dry. No rashes. Neuro: No focal deficits Musculoskeletal: Muscle strength 5/5 all ext Psychiatric: Mood and affect normal Neck: No JVD, no carotid bruits, no thyromegaly, no lymphadenopathy. Lungs:Clear bilaterally, no wheezes, rhonci, crackles Cardiovascular: Irregular. No murmurs, gallops or rubs. Abdomen:Soft. Bowel sounds present. Non-tender.  Extremities: No lower extremity edema. Pulses are 2 + in the bilateral DP/PT.  EKG: Atrial fibrillation, Non-specific T wave abnormality.

## 2011-05-29 NOTE — Assessment & Plan Note (Signed)
Stable. S/p DES Circumflex and RCA two weeks ago. Will stop ASA and continue Effient. Will start coumadin for atrial fibrillation.

## 2011-05-29 NOTE — Patient Instructions (Signed)
Your physician recommends that you schedule a follow-up appointment in: 1 month with Dr. Lisette Grinder have been referred to the Coumadin clinic.  Needs appt next Tuesday or Wednesday  Your physician has recommended you make the following change in your medication: Stop aspirin.  Start Coumadin 5 mg by mouth daily.

## 2011-06-02 ENCOUNTER — Ambulatory Visit (INDEPENDENT_AMBULATORY_CARE_PROVIDER_SITE_OTHER): Payer: Self-pay | Admitting: *Deleted

## 2011-06-02 DIAGNOSIS — Z7901 Long term (current) use of anticoagulants: Secondary | ICD-10-CM

## 2011-06-02 DIAGNOSIS — I4891 Unspecified atrial fibrillation: Secondary | ICD-10-CM

## 2011-06-02 NOTE — Patient Instructions (Signed)

## 2011-06-09 ENCOUNTER — Ambulatory Visit (INDEPENDENT_AMBULATORY_CARE_PROVIDER_SITE_OTHER): Payer: Self-pay | Admitting: *Deleted

## 2011-06-09 DIAGNOSIS — Z7901 Long term (current) use of anticoagulants: Secondary | ICD-10-CM

## 2011-06-09 DIAGNOSIS — I4891 Unspecified atrial fibrillation: Secondary | ICD-10-CM

## 2011-06-09 LAB — POCT INR: INR: 2.2

## 2011-06-16 ENCOUNTER — Ambulatory Visit (INDEPENDENT_AMBULATORY_CARE_PROVIDER_SITE_OTHER): Payer: Self-pay | Admitting: *Deleted

## 2011-06-16 DIAGNOSIS — Z7901 Long term (current) use of anticoagulants: Secondary | ICD-10-CM

## 2011-06-16 DIAGNOSIS — I4891 Unspecified atrial fibrillation: Secondary | ICD-10-CM

## 2011-06-16 LAB — POCT INR: INR: 2.6

## 2011-06-23 ENCOUNTER — Ambulatory Visit (INDEPENDENT_AMBULATORY_CARE_PROVIDER_SITE_OTHER): Payer: Self-pay | Admitting: *Deleted

## 2011-06-23 DIAGNOSIS — I4891 Unspecified atrial fibrillation: Secondary | ICD-10-CM

## 2011-06-23 DIAGNOSIS — Z7901 Long term (current) use of anticoagulants: Secondary | ICD-10-CM

## 2011-06-23 LAB — POCT INR: INR: 2.3

## 2011-06-29 ENCOUNTER — Encounter: Payer: Self-pay | Admitting: *Deleted

## 2011-06-29 ENCOUNTER — Ambulatory Visit (INDEPENDENT_AMBULATORY_CARE_PROVIDER_SITE_OTHER): Payer: Self-pay | Admitting: Cardiovascular Disease

## 2011-06-29 ENCOUNTER — Encounter: Payer: Self-pay | Admitting: Cardiovascular Disease

## 2011-06-29 ENCOUNTER — Ambulatory Visit (INDEPENDENT_AMBULATORY_CARE_PROVIDER_SITE_OTHER): Payer: Self-pay | Admitting: Pharmacist

## 2011-06-29 VITALS — BP 135/77 | HR 78 | Ht 72.0 in | Wt 190.0 lb

## 2011-06-29 DIAGNOSIS — I255 Ischemic cardiomyopathy: Secondary | ICD-10-CM

## 2011-06-29 DIAGNOSIS — I2589 Other forms of chronic ischemic heart disease: Secondary | ICD-10-CM

## 2011-06-29 DIAGNOSIS — Z7901 Long term (current) use of anticoagulants: Secondary | ICD-10-CM

## 2011-06-29 DIAGNOSIS — I4891 Unspecified atrial fibrillation: Secondary | ICD-10-CM

## 2011-06-29 DIAGNOSIS — I2510 Atherosclerotic heart disease of native coronary artery without angina pectoris: Secondary | ICD-10-CM

## 2011-06-29 LAB — BASIC METABOLIC PANEL
BUN: 18 mg/dL (ref 6–23)
Calcium: 8.9 mg/dL (ref 8.4–10.5)
GFR: 74.41 mL/min (ref >60.00)
Glucose, Bld: 92 mg/dL (ref 70–99)
Potassium: 4.7 mEq/L (ref 3.5–5.1)
Sodium: 137 mEq/L (ref 135–145)

## 2011-06-29 LAB — HEPATIC FUNCTION PANEL
ALT: 64 U/L — ABNORMAL HIGH (ref 0–53)
AST: 38 U/L — ABNORMAL HIGH (ref 0–37)
Bilirubin, Direct: 0.1 mg/dL (ref 0.0–0.3)
Total Bilirubin: 0.8 mg/dL (ref 0.3–1.2)
Total Protein: 7 g/dL (ref 6.0–8.3)

## 2011-06-29 LAB — LIPID PANEL
Cholesterol: 131 mg/dL (ref 0–200)
HDL: 36.6 mg/dL — ABNORMAL LOW (ref >39.00)
VLDL: 22.4 mg/dL (ref 0.0–40.0)

## 2011-06-29 MED ORDER — ISOSORBIDE MONONITRATE ER 30 MG PO TB24: 30.0000 mg | ORAL_TABLET | Freq: Every day | ORAL | Status: DC

## 2011-06-29 NOTE — Patient Instructions (Signed)
Your physician recommends that you schedule a follow-up appointment in: 6 weeks.   Your physician has recommended you make the following change in your medication: Start Imdur 30 mg by mouth daily.  Change echo appt from early April to late April  Your physician has recommended that you have a Cardioversion (DCCV). Electrical Cardioversion uses a jolt of electricity to your heart either through paddles or wired patches attached to your chest. This is a controlled, usually prescheduled, procedure. Defibrillation is done under light anesthesia in the hospital, and you usually go home the day of the procedure. This is done to get your heart back into a normal rhythm. You are not awake for the procedure. Please see the instruction sheet given to you today. Scheduled for July 02, 2011 at 10:00  You had lab work done today.

## 2011-06-29 NOTE — Assessment & Plan Note (Addendum)
He has multi-vessel CAD with recent stent placement. One episode of jaw pain. He does have smaller branch vessel disease. Will add Imdur 30 mg po Qdaily to his regimen. He is only on Effient now and no ASA since we started coumadin. Check lipids and LFTs today. Continue statin.

## 2011-06-29 NOTE — Assessment & Plan Note (Signed)
Still in atrial fibrillation today. Coumadin has been therapeutic for 4 weeks. Will arrange DCCV at [facility] this week. Check BMET today.

## 2011-06-29 NOTE — Progress Notes (Signed)
History of Present Illness: 61 y.o. WM with history of CAD diagnosed November 2012 at time of inferior STEMI, atrial fibrillation diagnosed November 2012, cardiomyopathy felt to be secondary to etoh and possibly ischemia, tobacco abuse, etoh abuse here today for cardiac follow up. He was admitted to [facility] November 2-8, 2012 with an an inferior STEMI. He was also noted to be in atrial fibrillation at the time of admission. Emergent LHC at [facility] 02/27/11 with occluded RCA treated with a Promus DES in the proximal RCA. He was also found to have 50% ostial diagonal stenosis, 70% proximal Circumflex stenosis, 80% mid Circumflex stenosis, 40% stenoses in both obtuse marginal branches and a 99% distal Circumflex stenosis in a small caliber portion of the vessel after the last OM branch. The LAD had minimal plaque disease. LVEF was 35% by LVgram but f/u echo showed LVEF 15%. He also has a h/o ETOH abuse and he was felt to be too high risk for coumadin due to this plus need for Effient with ACS/DES. Rate control was employed with metoprolol. Echo 02/27/11: EF 15%, mod LVE, mild to mod MR, mod BAE, mod RVE, mod decreased RVSF, PASP 48, mild to mod pulmo HTN. Biventricular failure felt to be out of proportion to CAD and suspected to have mixed ICM/NICM possibly related to ETOH. Lasix also started for systolic CHF. CHF meds were titrated. I saw him 03/26/11 for f/u and we discussed staging of other vessels. He wanted to wait until after Christmas. I brought him in January 16,2013 and placed drug eluting stents in the proximal and mid Circumflex and in the distal RCA. He has stopped smoking cigarettes and drinking alcohol. I started him on coumadin at his last visit here on 05/29/11. He has been following in the coumadin clinic. Plans were made to see him today and arrange DCCV if he is still in atrial fib. Plans also made for repeat echo in April 2013 and if LVEF still down then he will need an ICD.   He is here  today for follow up. He had some jaw pain over the weekend, lasted for four hours. This occurred at rest. His BP was ok. Otherwise he has felt well. He has been seen in coumadin clinic today and INR is 2.2.        Primary Care Physician: None  Last Lipid Profile:   Past Medical History  Diagnosis Date  . CAD (coronary artery disease)     Inf STEMI 11/12: LHC at [facility] 02/27/11: oDx 50%, pCFX 70%, mCFX 80%, OM1 40%, oOM2 40%, dCFX 99% (small - 2mm), pRCA occluded, EF 35%.  He underwent PCI with Promus DES to the pRCA. s/p planned PCI w/ DES to prox Lcx, DES to mid LCx, DES to distal RCA 05/13/11.  . Ischemic cardiomyopathy     Echo 02/27/11: EF 15%, mod LVE, mild to mod MR, mod BAE, mod RVE, mod decreased RVSF, PASP 48, mild to mod pulmo HTN.  Marland Kitchen Chronic systolic heart failure   . Atrial fibrillation     no coumadin due to ETOH use and need for Effient  . Alcohol abuse   . Tobacco abuse   . HLD (hyperlipidemia)   . Hemorrhoids   . HTN (hypertension)   . Chronic lower back pain     "sciatic nerve"    Past Surgical History  Procedure Date  . Back surgery ~ 1987    L4-5 ruptured disc  . Coronary angioplasty with stent placement  02/27/11    "1"  . Coronary angioplasty with stent placement 05/13/11    "3"    Current Outpatient Prescriptions  Medication Sig Dispense Refill  . atorvastatin (LIPITOR) 80 MG tablet Take 1 tablet (80 mg total) by mouth at bedtime.  30 tablet  6  . lisinopril (PRINIVIL,ZESTRIL) 10 MG tablet Take 1/2 tab daily       . metoprolol (LOPRESSOR) 100 MG tablet Take 0.5 tablets (50 mg total) by mouth 2 (two) times daily.  30 tablet  6  . nitroGLYCERIN (NITROSTAT) 0.4 MG SL tablet Place 1 tablet (0.4 mg total) under the tongue every 5 (five) minutes x 3 doses as needed for chest pain.  25 tablet  3  . prasugrel (EFFIENT) 10 MG TABS Take 1 tablet (10 mg total) by mouth daily.  30 tablet  6  . spironolactone (ALDACTONE) 25 MG tablet Take 1 tablet (25 mg total) by mouth  daily.  30 tablet  6  . warfarin (COUMADIN) 5 MG tablet Take 1 tablet (5 mg total) by mouth daily.  30 tablet  0    No Known Allergies  History   Social History  . Marital Status: Single    Spouse Name: N/A    Number of Children: N/A  . Years of Education: N/A   Occupational History  . Not on file.   Social History Main Topics  . Smoking status: Former Smoker -- 1.0 packs/day for 44 years    Types: Cigarettes    Quit date: 02/27/2011  . Smokeless tobacco: Former Neurosurgeon    Types: Chew    Quit date: 02/27/2011  . Alcohol Use: Yes     "stopped drinking alcohol 02/27/11; had been drinking 1 mixed drink qd 365 days/yr""  . Drug Use: Yes    Special: Marijuana     "used pot ages [age]"  . Sexually Active: Yes   Other Topics Concern  . Not on file   Social History Narrative  . No narrative on file    Family History  Problem Relation Age of Onset  . Hypertension Mother   . Heart attack Mother 33  . Stroke Father     5 STROKES AND MI IN HIS 80'S  . Heart attack Father 52    Review of Systems:  As stated in the HPI and otherwise negative.   BP 135/77  Pulse 78  Ht 6' (1.829 m)  Wt 190 lb (86.183 kg)  BMI 25.77 kg/m2  Physical Examination: General: Well developed, well nourished, NAD HEENT: OP clear, mucus membranes moist SKIN: warm, dry. No rashes. Neuro: No focal deficits Musculoskeletal: Muscle strength 5/5 all ext Psychiatric: Mood and affect normal Neck: No JVD, no carotid bruits, no thyromegaly, no lymphadenopathy. Lungs:Clear bilaterally, no wheezes, rhonci, crackles Cardiovascular: Irregular No murmurs, gallops or rubs. Abdomen:Soft. Bowel sounds present. Non-tender.  Extremities: No lower extremity edema. Pulses are 2 + in the bilateral DP/PT.  EKG: Atrial fibrillation, rate 78 bpm. QTCc 

## 2011-06-29 NOTE — Assessment & Plan Note (Signed)
Continue medical management for now. Repeat echo end of April 2013 and if LVEF is less than 35%, he will need referral to EP for ICD.

## 2011-06-30 ENCOUNTER — Encounter (HOSPITAL_COMMUNITY): Payer: Self-pay | Admitting: Pharmacy Technician

## 2011-06-30 ENCOUNTER — Telehealth: Payer: Self-pay | Admitting: Cardiology

## 2011-06-30 DIAGNOSIS — E785 Hyperlipidemia, unspecified: Secondary | ICD-10-CM

## 2011-06-30 NOTE — Telephone Encounter (Signed)
Fu call °Patient returning your call °

## 2011-06-30 NOTE — Telephone Encounter (Signed)
Spoke with pt and reviewed lab results and information from Dr. Clifton James. He will come in week of April 8 for LFT's

## 2011-07-01 MED ORDER — SODIUM CHLORIDE 0.9 % IV SOLN: INTRAVENOUS | Status: AC

## 2011-07-02 ENCOUNTER — Other Ambulatory Visit: Payer: Self-pay

## 2011-07-02 ENCOUNTER — Encounter (HOSPITAL_COMMUNITY): Payer: Self-pay | Admitting: Certified Registered"

## 2011-07-02 ENCOUNTER — Ambulatory Visit (HOSPITAL_COMMUNITY)
Admission: RE | Admit: 2011-07-02 | Discharge: 2011-07-02 | Disposition: A | Discharge status: Home / Self Care | Payer: Medicaid Other | Source: Ambulatory Visit | Attending: Cardiovascular Disease | Admitting: Cardiovascular Disease

## 2011-07-02 ENCOUNTER — Encounter (HOSPITAL_COMMUNITY)
Admission: RE | Disposition: A | Discharge status: Home / Self Care | Payer: Self-pay | Source: Ambulatory Visit | Attending: Cardiovascular Disease

## 2011-07-02 ENCOUNTER — Ambulatory Visit (HOSPITAL_COMMUNITY): Payer: Medicaid Other | Admitting: Certified Registered"

## 2011-07-02 DIAGNOSIS — K649 Unspecified hemorrhoids: Secondary | ICD-10-CM | POA: Insufficient documentation

## 2011-07-02 DIAGNOSIS — I2589 Other forms of chronic ischemic heart disease: Secondary | ICD-10-CM | POA: Insufficient documentation

## 2011-07-02 DIAGNOSIS — I4891 Unspecified atrial fibrillation: Secondary | ICD-10-CM

## 2011-07-02 DIAGNOSIS — Z9861 Coronary angioplasty status: Secondary | ICD-10-CM | POA: Insufficient documentation

## 2011-07-02 DIAGNOSIS — I5022 Chronic systolic (congestive) heart failure: Secondary | ICD-10-CM | POA: Insufficient documentation

## 2011-07-02 DIAGNOSIS — I10 Essential (primary) hypertension: Secondary | ICD-10-CM | POA: Insufficient documentation

## 2011-07-02 DIAGNOSIS — F17200 Nicotine dependence, unspecified, uncomplicated: Secondary | ICD-10-CM | POA: Insufficient documentation

## 2011-07-02 DIAGNOSIS — E785 Hyperlipidemia, unspecified: Secondary | ICD-10-CM | POA: Insufficient documentation

## 2011-07-02 DIAGNOSIS — I252 Old myocardial infarction: Secondary | ICD-10-CM | POA: Insufficient documentation

## 2011-07-02 DIAGNOSIS — F1010 Alcohol abuse, uncomplicated: Secondary | ICD-10-CM | POA: Insufficient documentation

## 2011-07-02 DIAGNOSIS — Z7902 Long term (current) use of antithrombotics/antiplatelets: Secondary | ICD-10-CM | POA: Insufficient documentation

## 2011-07-02 DIAGNOSIS — Z7901 Long term (current) use of anticoagulants: Secondary | ICD-10-CM | POA: Insufficient documentation

## 2011-07-02 DIAGNOSIS — M545 Low back pain, unspecified: Secondary | ICD-10-CM | POA: Insufficient documentation

## 2011-07-02 DIAGNOSIS — I2510 Atherosclerotic heart disease of native coronary artery without angina pectoris: Secondary | ICD-10-CM | POA: Insufficient documentation

## 2011-07-02 DIAGNOSIS — G8929 Other chronic pain: Secondary | ICD-10-CM | POA: Insufficient documentation

## 2011-07-02 HISTORY — PX: CARDIOVERSION: SHX1299

## 2011-07-02 LAB — CBC
Hemoglobin: 13.8 g/dL (ref 13.0–17.0)
MCH: 31.8 pg (ref 26.0–34.0)
MCHC: 35.2 g/dL (ref 30.0–36.0)
MCV: 90.3 fL (ref 78.0–100.0)

## 2011-07-02 LAB — PROTIME-INR: Prothrombin Time: 23.3 seconds — ABNORMAL HIGH (ref 11.6–15.2)

## 2011-07-02 SURGERY — CARDIOVERSION
Anesthesia: General | Wound class: Clean

## 2011-07-02 MED ORDER — PROPOFOL 10 MG/ML IV EMUL
INTRAVENOUS | Status: DC | PRN
  Administered 2011-07-02: 100 mg via INTRAVENOUS

## 2011-07-02 MED ORDER — LACTATED RINGERS IV SOLN
INTRAVENOUS | Status: DC | PRN
  Administered 2011-07-02: 10:00:00 via INTRAVENOUS

## 2011-07-02 NOTE — H&P (View-Only) (Signed)
 History of Present Illness: 61 y.o. WM with history of CAD diagnosed November 2012 at time of inferior STEMI, atrial fibrillation diagnosed November 2012, cardiomyopathy felt to be secondary to etoh and possibly ischemia, tobacco abuse, etoh abuse here today for cardiac follow up. He was admitted to [facility] November 2-8, 2012 with an an inferior STEMI. He was also noted to be in atrial fibrillation at the time of admission. Emergent LHC at [facility] 02/27/11 with occluded RCA treated with a Promus DES in the proximal RCA. He was also found to have 50% ostial diagonal stenosis, 70% proximal Circumflex stenosis, 80% mid Circumflex stenosis, 40% stenoses in both obtuse marginal branches and a 99% distal Circumflex stenosis in a small caliber portion of the vessel after the last OM branch. The LAD had minimal plaque disease. LVEF was 35% by LVgram but f/u echo showed LVEF 15%. He also has a h/o ETOH abuse and he was felt to be too high risk for coumadin due to this plus need for Effient with ACS/DES. Rate control was employed with metoprolol. Echo 02/27/11: EF 15%, mod LVE, mild to mod MR, mod BAE, mod RVE, mod decreased RVSF, PASP 48, mild to mod pulmo HTN. Biventricular failure felt to be out of proportion to CAD and suspected to have mixed ICM/NICM possibly related to ETOH. Lasix also started for systolic CHF. CHF meds were titrated. I saw him 03/26/11 for f/u and we discussed staging of other vessels. He wanted to wait until after Christmas. I brought him in January 16,2013 and placed drug eluting stents in the proximal and mid Circumflex and in the distal RCA. He has stopped smoking cigarettes and drinking alcohol. I started him on coumadin at his last visit here on 05/29/11. He has been following in the coumadin clinic. Plans were made to see him today and arrange DCCV if he is still in atrial fib. Plans also made for repeat echo in April 2013 and if LVEF still down then he will need an ICD.   He is here  today for follow up. He had some jaw pain over the weekend, lasted for four hours. This occurred at rest. His BP was ok. Otherwise he has felt well. He has been seen in coumadin clinic today and INR is 2.2.        Primary Care Physician: None  Last Lipid Profile:   Past Medical History  Diagnosis Date  . CAD (coronary artery disease)     Inf STEMI 11/12: LHC at [facility] 02/27/11: oDx 50%, pCFX 70%, mCFX 80%, OM1 40%, oOM2 40%, dCFX 99% (small - 2mm), pRCA occluded, EF 35%.  He underwent PCI with Promus DES to the pRCA. s/p planned PCI w/ DES to prox Lcx, DES to mid LCx, DES to distal RCA 05/13/11.  . Ischemic cardiomyopathy     Echo 02/27/11: EF 15%, mod LVE, mild to mod MR, mod BAE, mod RVE, mod decreased RVSF, PASP 48, mild to mod pulmo HTN.  . Chronic systolic heart failure   . Atrial fibrillation     no coumadin due to ETOH use and need for Effient  . Alcohol abuse   . Tobacco abuse   . HLD (hyperlipidemia)   . Hemorrhoids   . HTN (hypertension)   . Chronic lower back pain     "sciatic nerve"    Past Surgical History  Procedure Date  . Back surgery ~ 1987    L4-5 ruptured disc  . Coronary angioplasty with stent placement   02/27/11    "1"  . Coronary angioplasty with stent placement 05/13/11    "3"    Current Outpatient Prescriptions  Medication Sig Dispense Refill  . atorvastatin (LIPITOR) 80 MG tablet Take 1 tablet (80 mg total) by mouth at bedtime.  30 tablet  6  . lisinopril (PRINIVIL,ZESTRIL) 10 MG tablet Take 1/2 tab daily       . metoprolol (LOPRESSOR) 100 MG tablet Take 0.5 tablets (50 mg total) by mouth 2 (two) times daily.  30 tablet  6  . nitroGLYCERIN (NITROSTAT) 0.4 MG SL tablet Place 1 tablet (0.4 mg total) under the tongue every 5 (five) minutes x 3 doses as needed for chest pain.  25 tablet  3  . prasugrel (EFFIENT) 10 MG TABS Take 1 tablet (10 mg total) by mouth daily.  30 tablet  6  . spironolactone (ALDACTONE) 25 MG tablet Take 1 tablet (25 mg total) by mouth  daily.  30 tablet  6  . warfarin (COUMADIN) 5 MG tablet Take 1 tablet (5 mg total) by mouth daily.  30 tablet  0    No Known Allergies  History   Social History  . Marital Status: Single    Spouse Name: N/A    Number of Children: N/A  . Years of Education: N/A   Occupational History  . Not on file.   Social History Main Topics  . Smoking status: Former Smoker -- 1.0 packs/day for 44 years    Types: Cigarettes    Quit date: 02/27/2011  . Smokeless tobacco: Former User    Types: Chew    Quit date: 02/27/2011  . Alcohol Use: Yes     "stopped drinking alcohol 02/27/11; had been drinking 1 mixed drink qd 365 days/yr""  . Drug Use: Yes    Special: Marijuana     "used pot ages [age]"  . Sexually Active: Yes   Other Topics Concern  . Not on file   Social History Narrative  . No narrative on file    Family History  Problem Relation Age of Onset  . Hypertension Mother   . Heart attack Mother 81  . Stroke Father     5 STROKES AND MI IN HIS 80'S  . Heart attack Father 80    Review of Systems:  As stated in the HPI and otherwise negative.   BP 135/77  Pulse 78  Ht 6' (1.829 m)  Wt 190 lb (86.183 kg)  BMI 25.77 kg/m2  Physical Examination: General: Well developed, well nourished, NAD HEENT: OP clear, mucus membranes moist SKIN: warm, dry. No rashes. Neuro: No focal deficits Musculoskeletal: Muscle strength 5/5 all ext Psychiatric: Mood and affect normal Neck: No JVD, no carotid bruits, no thyromegaly, no lymphadenopathy. Lungs:Clear bilaterally, no wheezes, rhonci, crackles Cardiovascular: Irregular No murmurs, gallops or rubs. Abdomen:Soft. Bowel sounds present. Non-tender.  Extremities: No lower extremity edema. Pulses are 2 + in the bilateral DP/PT.  EKG: Atrial fibrillation, rate 78 bpm. QTCc 476msec 

## 2011-07-02 NOTE — Addendum Note (Signed)
Addendum  created 07/02/11 1032 by Rossie Muskrat, CRNA   Modules edited:Anesthesia Events, Anesthesia Medication Administration

## 2011-07-02 NOTE — Transfer of Care (Signed)
Immediate Anesthesia Transfer of Care Note  Patient: Andres Ward  Procedure(s) Performed: Procedure(s) (LRB): CARDIOVERSION (N/A)  Patient Location: Short Stay  Anesthesia Type: MAC  Level of Consciousness: awake and alert   Airway & Oxygen Therapy: Patient Spontanous Breathing and Patient connected to nasal cannula oxygen  Post-op Assessment: Report given to PACU RN, Post -op Vital signs reviewed and stable and Patient moving all extremities  Post vital signs: Reviewed and stable  Complications: No apparent anesthesia complications

## 2011-07-02 NOTE — Discharge Instructions (Signed)
Electrical Cardioversion Cardioversion is the delivery of a jolt of electricity to change the rhythm of the heart. Sticky patches or metal paddles are placed on the chest to deliver the electricity from a special device. This is done to restore a normal rhythm. A rhythm that is too fast or not regular keeps the heart from pumping well. Compared to medicines used to change an abnormal rhythm, cardioversion is faster and works better. It is also unpleasant and may dislodge blood clots from the heart. WHEN WOULD THIS BE DONE?  In an emergency:   There is low or no blood pressure as a result of the heart rhythm.   Normal rhythm must be restored as fast as possible to protect the brain and heart from further damage.   It may save a life.   For less serious heart rhythms, such as atrial fibrillation or flutter, in which:   The heart is beating too fast or is not regular.   The heart is still able to pump enough blood, but not as well as it should.   Medicine to change the rhythm has not worked.   It is safe to wait in order to allow time for preparation.  LET YOUR CAREGIVER KNOW ABOUT:   Every medicine you are taking. It is very important to do this! Know when to take or stop taking any of them.   Any time in the past that you have felt your heart was not beating normally.  RISKS AND COMPLICATIONS   Clots may form in the chambers of the heart if it is beating too fast. These clots may be dislodged during the procedure and travel to other parts of the body.   There is risk of a stroke during and after the procedure if a clot moves. Blood thinners lower this risk.   You may have a special test of your heart (TEE) to make sure there are no clots in your heart.  BEFORE THE PROCEDURE   You may have some tests to see how well your heart is working.   You may start taking blood thinners so your blood does not clot as easily.   Other drugs may be given to help your heart work better.   PROCEDURE (SCHEDULED)  The procedure is typically done in a hospital by a heart doctor (cardiologist).   You will be told when and where to go.   You may be given some medicine through an intravenous (IV) access to reduce discomfort and make you sleepy before the procedure.   Your whole body may move when the shock is delivered. Your chest may feel sore.   You may be able to go home after a few hours. Your heart rhythm will be watched to make sure it does not change.  HOME CARE INSTRUCTIONS   Only take medicine as directed by your caregiver. Be sure you understand how and when to take your medicine.   Learn how to feel your pulse and check it often.   Limit your activity for 48 hours.   Avoid caffeine and other stimulants as directed.  SEEK MEDICAL CARE IF:   You feel like your heart is beating too fast or your pulse is not regular.   You have any questions about your medicines.   You have bleeding that will not stop.  SEEK IMMEDIATE MEDICAL CARE IF:   You are dizzy or feel faint.   It is hard to breathe or you feel short of breath.     There is a change in discomfort in your chest.   Your speech is slurred or you have trouble moving your arm or leg on one side.   You get a muscle cramp.   Your fingers or toes turn cold or blue.  MAKE SURE YOU:   Understand these instructions.   Will watch your condition.   Will get help right away if you are not doing well or get worse.  Document Released: 04/03/2002 Document Revised: 04/02/2011 Document Reviewed: 08/03/2007 [employer] Patient Information 2012 [employer]. 

## 2011-07-02 NOTE — CV Procedure (Signed)
    Cardioversion Report  Andres Ward [id number] 3/7/201310:37 AM No primary provider on file.  Procedure Performed:  1. Elective DC cardioversion with 200J x 2.  Operator: Verne Carrow, MD  Indication:  Atrial fibrillation                      Procedure Details: The risks, benefits, complications, treatment options, and expected outcomes were discussed with the patient. The patient and/or family concurred with the proposed plan, giving informed consent. The patient was brought to the short stay area and peripheral IV access was obtained. Anesthesia at bedside. Pt given sedation per anesthesia team.  200J delivered x 1 with return to atrial fibrillation. 200J delivered again x1 and patient had return to normal sinus rhythm.   Impression: 1. Atrial fibrillation 2. Successful DCCV with return to NSR   Recommendations: Will continue anticoagulation and beta blocker.        Complications:  None. The patient tolerated the procedure well.

## 2011-07-02 NOTE — Preoperative (Signed)
Beta Blockers   Reason not to administer Beta Blockers:Pt took B blocker 07/02/11 @ 0615

## 2011-07-02 NOTE — Anesthesia Preprocedure Evaluation (Addendum)
Anesthesia Evaluation  Patient identified by MRN, date of birth, ID band Patient awake    Reviewed: Allergy & Precautions, H&P , NPO status , Patient's Chart, lab work & pertinent test results, reviewed documented beta blocker date and time   Airway Mallampati: I TM Distance: >3 FB Neck ROM: Full    Dental  (+) Teeth Intact   Pulmonary  breath sounds clear to auscultation        Cardiovascular hypertension, Pt. on home beta blockers + CAD and + Past MI + dysrhythmias Atrial Fibrillation Rhythm:Regular Rate:Normal     Neuro/Psych    GI/Hepatic   Endo/Other    Renal/GU      Musculoskeletal   Abdominal   Peds  Hematology   Anesthesia Other Findings   Reproductive/Obstetrics                         Anesthesia Physical Anesthesia Plan  ASA: III  Anesthesia Plan: General   Post-op Pain Management:    Induction: Intravenous  Airway Management Planned: Mask  Additional Equipment:   Intra-op Plan:   Post-operative Plan: Extubation in OR  Informed Consent: I have reviewed the patients History and Physical, chart, labs and discussed the procedure including the risks, benefits and alternatives for the proposed anesthesia with the patient or authorized representative who has indicated his/her understanding and acceptance.   Dental advisory given  Plan Discussed with: CRNA, Anesthesiologist and Surgeon  Anesthesia Plan Comments:         Anesthesia Quick Evaluation

## 2011-07-02 NOTE — Interval H&P Note (Signed)
History and Physical Interval Note:  07/02/2011 9:58 AM  Andres Ward  has presented today for DCCV  with the diagnosis of AFIB  The various methods of treatment have been discussed with the patient and family. After consideration of risks, benefits and other options for treatment, the patient has consented to  Procedure(s) (LRB): CARDIOVERSION (N/A) as a surgical intervention .  The patients' history has been reviewed, patient examined, no change in status, stable for surgery.  I have reviewed the patients' chart and labs.  Questions were answered to the patient's satisfaction.     Najia Hurlbutt

## 2011-07-02 NOTE — Anesthesia Postprocedure Evaluation (Signed)
  Anesthesia Post-op Note  Patient: Andres Ward  Procedure(s) Performed: Procedure(s) (LRB): CARDIOVERSION (N/A)  Patient Location: Short Stay  Anesthesia Type: MAC  Level of Consciousness: awake and alert   Airway and Oxygen Therapy: Patient Spontanous Breathing and Patient connected to nasal cannula oxygen  Post-op Pain: none  Post-op Assessment: Post-op Vital signs reviewed, Patient's Cardiovascular Status Stable, Respiratory Function Stable, Patent Airway, No signs of Nausea or vomiting, Adequate PO intake and Pain level controlled  Post-op Vital Signs: Reviewed and stable  Complications: No apparent anesthesia complications

## 2011-07-03 ENCOUNTER — Other Ambulatory Visit: Payer: Self-pay | Admitting: Cardiovascular Disease

## 2011-07-03 ENCOUNTER — Encounter (HOSPITAL_COMMUNITY): Payer: Self-pay | Admitting: Cardiovascular Disease

## 2011-07-03 DIAGNOSIS — I4891 Unspecified atrial fibrillation: Secondary | ICD-10-CM

## 2011-07-03 MED ORDER — WARFARIN SODIUM 5 MG PO TABS: ORAL_TABLET | ORAL | Status: DC

## 2011-07-03 NOTE — Telephone Encounter (Signed)
I spoke with the patient. He states that he was cardioverted yesterday and now his HR is in the upper 40's and his BP is 109/72. He feels fine. I explained to him it is not uncommon for the rates to be slower post cardioversion due to the drugs on board. I made him aware we may need to decrease his metoprolol to 25 mg BID to help his rates come up. I will review with Dr. Tenny Craw since his most recent EF is 15%. Sherri Rad, RN, BSN   Reviewed with Dr. Tenny Craw, orders given to have the patient decrease metoprolol to 25 mg BID over the weekend and call back on Monday with his rates. Due to his low EF, he may need to have his metoprolol increased back to 50 mg BID is his HR's will allow. Sherri Rad, RN, BSN

## 2011-07-03 NOTE — Telephone Encounter (Signed)
New msg Pt wants to talk to someone about his heart rate 47-49. He had cardioversion yesterday. He said he is feeling fine. Please call

## 2011-07-06 ENCOUNTER — Ambulatory Visit (INDEPENDENT_AMBULATORY_CARE_PROVIDER_SITE_OTHER): Payer: Self-pay

## 2011-07-06 DIAGNOSIS — Z7901 Long term (current) use of anticoagulants: Secondary | ICD-10-CM

## 2011-07-06 DIAGNOSIS — I4891 Unspecified atrial fibrillation: Secondary | ICD-10-CM

## 2011-07-15 ENCOUNTER — Telehealth: Payer: Self-pay | Admitting: *Deleted

## 2011-07-15 NOTE — Telephone Encounter (Signed)
Left message on pt's identified voice mail that Effient shipment arrived in office and that it would be at front desk for him to pick up.

## 2011-07-20 ENCOUNTER — Ambulatory Visit (INDEPENDENT_AMBULATORY_CARE_PROVIDER_SITE_OTHER): Payer: Self-pay | Admitting: *Deleted

## 2011-07-20 DIAGNOSIS — I4891 Unspecified atrial fibrillation: Secondary | ICD-10-CM

## 2011-07-20 DIAGNOSIS — Z7901 Long term (current) use of anticoagulants: Secondary | ICD-10-CM

## 2011-07-24 ENCOUNTER — Other Ambulatory Visit (HOSPITAL_COMMUNITY): Payer: Self-pay | Admitting: Radiology

## 2011-07-27 ENCOUNTER — Other Ambulatory Visit (HOSPITAL_COMMUNITY): Payer: Self-pay

## 2011-08-05 ENCOUNTER — Other Ambulatory Visit (INDEPENDENT_AMBULATORY_CARE_PROVIDER_SITE_OTHER): Payer: Self-pay

## 2011-08-05 ENCOUNTER — Ambulatory Visit (INDEPENDENT_AMBULATORY_CARE_PROVIDER_SITE_OTHER): Payer: Self-pay | Admitting: *Deleted

## 2011-08-05 DIAGNOSIS — Z7901 Long term (current) use of anticoagulants: Secondary | ICD-10-CM

## 2011-08-05 DIAGNOSIS — E785 Hyperlipidemia, unspecified: Secondary | ICD-10-CM

## 2011-08-05 DIAGNOSIS — I4891 Unspecified atrial fibrillation: Secondary | ICD-10-CM

## 2011-08-05 LAB — HEPATIC FUNCTION PANEL
AST: 47 U/L — ABNORMAL HIGH (ref 0–37)
Albumin: 4.2 g/dL (ref 3.5–5.2)
Alkaline Phosphatase: 104 U/L (ref 39–117)
Bilirubin, Direct: 0.2 mg/dL (ref 0.0–0.3)
Total Bilirubin: 0.9 mg/dL (ref 0.3–1.2)

## 2011-08-05 LAB — POCT INR: INR: 2.1

## 2011-08-06 ENCOUNTER — Other Ambulatory Visit: Payer: Self-pay | Admitting: *Deleted

## 2011-08-06 DIAGNOSIS — E785 Hyperlipidemia, unspecified: Secondary | ICD-10-CM

## 2011-08-06 MED ORDER — ATORVASTATIN CALCIUM 80 MG PO TABS: 80.0000 mg | ORAL_TABLET | Freq: Every day | ORAL | Status: DC

## 2011-08-14 ENCOUNTER — Other Ambulatory Visit (HOSPITAL_COMMUNITY): Payer: Self-pay | Admitting: Cardiovascular Disease

## 2011-08-14 DIAGNOSIS — I2510 Atherosclerotic heart disease of native coronary artery without angina pectoris: Secondary | ICD-10-CM

## 2011-08-17 ENCOUNTER — Other Ambulatory Visit (HOSPITAL_COMMUNITY): Payer: Self-pay

## 2011-08-17 ENCOUNTER — Ambulatory Visit (HOSPITAL_COMMUNITY): Payer: Medicaid Other | Attending: Internal Medicine

## 2011-08-17 ENCOUNTER — Other Ambulatory Visit: Payer: Self-pay

## 2011-08-17 DIAGNOSIS — F17200 Nicotine dependence, unspecified, uncomplicated: Secondary | ICD-10-CM | POA: Insufficient documentation

## 2011-08-17 DIAGNOSIS — I252 Old myocardial infarction: Secondary | ICD-10-CM | POA: Insufficient documentation

## 2011-08-17 DIAGNOSIS — I428 Other cardiomyopathies: Secondary | ICD-10-CM | POA: Insufficient documentation

## 2011-08-17 DIAGNOSIS — I2789 Other specified pulmonary heart diseases: Secondary | ICD-10-CM | POA: Insufficient documentation

## 2011-08-17 DIAGNOSIS — I059 Rheumatic mitral valve disease, unspecified: Secondary | ICD-10-CM | POA: Insufficient documentation

## 2011-08-17 DIAGNOSIS — I4891 Unspecified atrial fibrillation: Secondary | ICD-10-CM | POA: Insufficient documentation

## 2011-08-17 DIAGNOSIS — F1010 Alcohol abuse, uncomplicated: Secondary | ICD-10-CM | POA: Insufficient documentation

## 2011-08-17 DIAGNOSIS — I509 Heart failure, unspecified: Secondary | ICD-10-CM | POA: Insufficient documentation

## 2011-08-17 DIAGNOSIS — I2510 Atherosclerotic heart disease of native coronary artery without angina pectoris: Secondary | ICD-10-CM | POA: Insufficient documentation

## 2011-08-17 DIAGNOSIS — I2589 Other forms of chronic ischemic heart disease: Secondary | ICD-10-CM | POA: Insufficient documentation

## 2011-08-20 ENCOUNTER — Ambulatory Visit (INDEPENDENT_AMBULATORY_CARE_PROVIDER_SITE_OTHER): Payer: Self-pay

## 2011-08-20 ENCOUNTER — Encounter: Payer: Self-pay | Admitting: Cardiovascular Disease

## 2011-08-20 ENCOUNTER — Ambulatory Visit (INDEPENDENT_AMBULATORY_CARE_PROVIDER_SITE_OTHER): Payer: Self-pay | Admitting: Cardiovascular Disease

## 2011-08-20 VITALS — BP 154/84 | HR 52 | Ht 72.0 in | Wt 191.0 lb

## 2011-08-20 DIAGNOSIS — I2510 Atherosclerotic heart disease of native coronary artery without angina pectoris: Secondary | ICD-10-CM

## 2011-08-20 DIAGNOSIS — I255 Ischemic cardiomyopathy: Secondary | ICD-10-CM

## 2011-08-20 DIAGNOSIS — I10 Essential (primary) hypertension: Secondary | ICD-10-CM

## 2011-08-20 DIAGNOSIS — I4891 Unspecified atrial fibrillation: Secondary | ICD-10-CM

## 2011-08-20 DIAGNOSIS — Z7901 Long term (current) use of anticoagulants: Secondary | ICD-10-CM

## 2011-08-20 DIAGNOSIS — I2589 Other forms of chronic ischemic heart disease: Secondary | ICD-10-CM

## 2011-08-20 MED ORDER — LISINOPRIL 5 MG PO TABS: ORAL_TABLET | ORAL | Status: DC

## 2011-08-20 NOTE — Assessment & Plan Note (Signed)
LV function has improved post revascularization, now near normal. Will continue medical management with beta blocker, Ace-inh.

## 2011-08-20 NOTE — Assessment & Plan Note (Addendum)
Stable. Continue Effient for now. Continue beta blocker, Ace-inhibitor and statin. Lipids are well controlled. He is not on an ASA because of therapy with coumadin and Effient.

## 2011-08-20 NOTE — Patient Instructions (Signed)
Your physician wants you to follow-up in:6 months.  You will receive a reminder letter in the mail two months in advance. If you don't receive a letter, please call our office to schedule the follow-up appointment. We will do lab work that day  Your physician has recommended you make the following change in your medication: Change Lisinopril to 5 mg by mouth every AM and 5 mg every PM.  Call us when you open last bottle of Effient so we can reorder.

## 2011-08-20 NOTE — Assessment & Plan Note (Signed)
Maintaining NSR. Will keep on Coumadin for now. Reassess in 6 months. Continue beta blocker.

## 2011-08-20 NOTE — Assessment & Plan Note (Signed)
BP elevated in am now that his LV function is normal. Will change Lisinopril to 5mg  po BID. He will follow at home.

## 2011-08-20 NOTE — Progress Notes (Signed)
History of Present Illness: 61 y.o. WM with history of CAD diagnosed November 2012 at time of inferior STEMI, atrial fibrillation diagnosed November 2012, cardiomyopathy felt to be secondary to etoh and possibly ischemia, tobacco abuse, etoh abuse here today for cardiac follow up. He was admitted to [facility] November 2-8, 2012 with an an inferior STEMI. He was also noted to be in atrial fibrillation at the time of admission. Emergent LHC at [facility] 02/27/11 with occluded RCA treated with a Promus DES in the proximal RCA. He was also found to have 50% ostial diagonal stenosis, 70% proximal Circumflex stenosis, 80% mid Circumflex stenosis, 40% stenoses in both obtuse marginal branches and a 99% distal Circumflex stenosis in a small caliber portion of the vessel after the last OM branch. The LAD had minimal plaque disease. LVEF was 35% by LVgram but f/u echo showed LVEF 15%. He also has a h/o ETOH abuse and he was felt to be too high risk for coumadin due to this plus need for Effient with ACS/DES. Rate control was employed with metoprolol. Echo 02/27/11: EF 15%, mod LVE, mild to mod MR, mod BAE, mod RVE, mod decreased RVSF, PASP 48, mild to mod pulmo HTN. Biventricular failure felt to be out of proportion to CAD and suspected to have mixed ICM/NICM possibly related to ETOH. Lasix also started for systolic CHF. CHF meds were titrated. I saw him 03/26/11 for f/u and we discussed staging of other vessels. He wanted to wait until after Christmas. I brought him in January 16,2013 and placed drug eluting stents in the proximal and mid Circumflex and in the distal RCA. He has stopped smoking cigarettes and drinking alcohol. I started him on coumadin at his visit here on 05/29/11. He has been following in the coumadin clinic. DCCV in March 2013. He has maintained sinus rhythm. Echo August 17, 2011 with  Resolution of LV function with LVEF of 45-50% by my review. This is a significant improvement.   He is here today  for follow up. He is doing great. Maintaining NSR. No chest pain or SOB. BP has been elevated in the am but well controlled at night. He has seen a huge improvement in energy level since he was cardioverted.    Primary Care Physician: None  Lipid Panel     Component Value Date/Time   CHOL 131 06/29/2011 1007   TRIG 112.0 06/29/2011 1007   HDL 36.60* 06/29/2011 1007   CHOLHDL 4 06/29/2011 1007   VLDL 22.4 06/29/2011 1007   LDLCALC 72 06/29/2011 1007      Past Medical History  Diagnosis Date  . CAD (coronary artery disease)     Inf STEMI 11/12: LHC at [facility] 02/27/11: oDx 50%, pCFX 70%, mCFX 80%, OM1 40%, oOM2 40%, dCFX 99% (small - 2mm), pRCA occluded, EF 35%.  He underwent PCI with Promus DES to the pRCA. s/p planned PCI w/ DES to prox Lcx, DES to mid LCx, DES to distal RCA 05/13/11.  . Ischemic cardiomyopathy     Echo 02/27/11: EF 15%, mod LVE, mild to mod MR, mod BAE, mod RVE, mod decreased RVSF, PASP 48, mild to mod pulmo HTN.  Marland Kitchen Chronic systolic heart failure   . Atrial fibrillation     On coumadin  . Alcohol abuse   . Tobacco abuse   . HLD (hyperlipidemia)   . Hemorrhoids   . HTN (hypertension)   . Chronic lower back pain     "sciatic nerve"  Past Surgical History  Procedure Date  . Back surgery ~ 1987    L4-5 ruptured disc  . Coronary angioplasty with stent placement 02/27/11    "1"  . Coronary angioplasty with stent placement 05/13/11    "3"  . Cardioversion 07/02/2011    Procedure: CARDIOVERSION;  Surgeon: Kathleene Hazel, MD;  Location: [facility] OR;  Service: Cardiovascular;  Laterality: N/A;    Current Outpatient Prescriptions  Medication Sig Dispense Refill  . atorvastatin (LIPITOR) 80 MG tablet Take 1 tablet (80 mg total) by mouth daily.  30 tablet  6  . isosorbide mononitrate (IMDUR) 30 MG 24 hr tablet Take 1 tablet (30 mg total) by mouth daily.  30 tablet  11  . lisinopril (PRINIVIL,ZESTRIL) 10 MG tablet Take 5 mg by mouth daily.       . metoprolol (LOPRESSOR) 100 MG  tablet Take 0.5 tablets (50 mg total) by mouth 2 (two) times daily.  30 tablet  6  . nitroGLYCERIN (NITROSTAT) 0.4 MG SL tablet Place 0.4 mg under the tongue every 5 (five) minutes as needed. For chest pain      . prasugrel (EFFIENT) 10 MG TABS Take 1 tablet (10 mg total) by mouth daily.  30 tablet  6  . spironolactone (ALDACTONE) 25 MG tablet Take 1 tablet (25 mg total) by mouth daily.  30 tablet  6  . warfarin (COUMADIN) 5 MG tablet Take as directed by anticoagulation clinic  30 tablet  3  . DISCONTD: lisinopril (PRINIVIL,ZESTRIL) 10 MG tablet Take 1 tablet (10 mg total) by mouth daily.  30 tablet  11    No Known Allergies  History   Social History  . Marital Status: Single    Spouse Name: N/A    Number of Children: N/A  . Years of Education: N/A   Occupational History  . Not on file.   Social History Main Topics  . Smoking status: Former Smoker -- 1.0 packs/day for 44 years    Types: Cigarettes    Quit date: 02/27/2011  . Smokeless tobacco: Former Neurosurgeon    Types: Chew    Quit date: 02/27/2011  . Alcohol Use: Yes     "stopped drinking alcohol 02/27/11; had been drinking 1 mixed drink qd 365 days/yr""  . Drug Use: Yes    Special: Marijuana     "used pot ages [age]"  . Sexually Active: Yes   Other Topics Concern  . Not on file   Social History Narrative  . No narrative on file    Family History  Problem Relation Age of Onset  . Hypertension Mother   . Heart attack Mother 34  . Stroke Father     5 STROKES AND MI IN HIS 80'S  . Heart attack Father 72    Review of Systems:  As stated in the HPI and otherwise negative.   BP 154/84  Pulse 52  Ht 6' (1.829 m)  Wt 191 lb (86.637 kg)  BMI 25.90 kg/m2  Physical Examination: General: Well developed, well nourished, NAD HEENT: OP clear, mucus membranes moist SKIN: warm, dry. No rashes. Neuro: No focal deficits Musculoskeletal: Muscle strength 5/5 all ext Psychiatric: Mood and affect normal Neck: No JVD, no carotid  bruits, no thyromegaly, no lymphadenopathy. Lungs:Clear bilaterally, no wheezes, rhonci, crackles Cardiovascular: Regular rate and rhythm. No murmurs, gallops or rubs. Abdomen:Soft. Bowel sounds present. Non-tender.  Extremities: No lower extremity edema. Pulses are 2 + in the bilateral DP/PT.  EKG: Sinus bradycardia. Rate 52bpm.  Echo 08/17/11:  Left ventricle: The cavity size was normal. Wall thickness was increased in a pattern of mild LVH. Systolic function was normal. The estimated ejection fraction was in the range of 50% to 55%. Doppler parameters are consistent with abnormal left ventricular relaxation (grade 1 diastolic dysfunction). - Mitral valve: Mild regurgitation.

## 2011-09-03 ENCOUNTER — Ambulatory Visit (INDEPENDENT_AMBULATORY_CARE_PROVIDER_SITE_OTHER): Payer: Medicaid Other | Admitting: *Deleted

## 2011-09-03 DIAGNOSIS — Z7901 Long term (current) use of anticoagulants: Secondary | ICD-10-CM

## 2011-09-03 DIAGNOSIS — I4891 Unspecified atrial fibrillation: Secondary | ICD-10-CM

## 2011-09-17 ENCOUNTER — Ambulatory Visit (INDEPENDENT_AMBULATORY_CARE_PROVIDER_SITE_OTHER): Payer: Medicaid Other | Admitting: *Deleted

## 2011-09-17 DIAGNOSIS — Z7901 Long term (current) use of anticoagulants: Secondary | ICD-10-CM

## 2011-09-17 DIAGNOSIS — I4891 Unspecified atrial fibrillation: Secondary | ICD-10-CM

## 2011-09-17 LAB — POCT INR: INR: 2

## 2011-10-07 ENCOUNTER — Ambulatory Visit (INDEPENDENT_AMBULATORY_CARE_PROVIDER_SITE_OTHER): Payer: Medicaid Other | Admitting: *Deleted

## 2011-10-07 DIAGNOSIS — I4891 Unspecified atrial fibrillation: Secondary | ICD-10-CM

## 2011-10-07 DIAGNOSIS — Z7901 Long term (current) use of anticoagulants: Secondary | ICD-10-CM

## 2011-10-07 LAB — POCT INR: INR: 2.3

## 2011-10-26 ENCOUNTER — Telehealth: Payer: Self-pay | Admitting: Physician Assistant

## 2011-10-26 NOTE — Telephone Encounter (Signed)
Andres Bible, Could we arrange a nurse visit tomorrow for an EKG? He had atrial fib but cardioverted to sinus this spring. Thanks, chris

## 2011-10-26 NOTE — Telephone Encounter (Signed)
Patient called the answering service regarding an abnormal heart rhythm. On further review, the patient has a history of CAD s/p multiple PCIs, ischemic and nonischemic CM secondary to EtOH abuse and PAF. He was cardioverted approximately 5 months ago and had been sustaining NSR. He follows up in the Coumadin clinic. Most recent INR 2.3 on 10/07/11. Sunday morning, he awoke, "felt funny," and checked his HR and BP on machine. Received error messages initially, and finally resulted a HR of 105 bpm. BP WNL (117/82). He notes feeling "irregular beats" in his chest as well. He is fairly certain he is in an abnormal rhythm, but denies lightheadedness, shortness of breath, chest pain or syncope. States other than the palpitations, nothing is out of the ordinary, however this does cause him more anxiety. He reports increased stress and pain from his sciatica lately. No alcohol, tobacco, supplements, new medications or illicit drugs. No new illnesses. Has been compliant with all medications. Advised to take Lopressor PM dose early today. Resume taking tomorrow morning. Will forward note to Dr. Clifton James and leave a message at the office for formal EKG. Advised if the patient's HR markedly accelerates (>120-130 bpm), to take an additional Lopressor. He states Lopressor has kept HR in mid-70s during this time. If tachycardia persists and/or he develops the above symptoms, advised to be driven to the ED or call 911. He understood and agreed.    Jacqulyn Bath, PA-C 10/26/2011 6:03 PM

## 2011-10-27 ENCOUNTER — Telehealth: Payer: Self-pay | Admitting: *Deleted

## 2011-10-27 ENCOUNTER — Ambulatory Visit (INDEPENDENT_AMBULATORY_CARE_PROVIDER_SITE_OTHER): Payer: Medicaid Other | Admitting: *Deleted

## 2011-10-27 VITALS — BP 112/80 | HR 109

## 2011-10-27 DIAGNOSIS — I4891 Unspecified atrial fibrillation: Secondary | ICD-10-CM

## 2011-10-27 NOTE — Telephone Encounter (Signed)
Per tony the pa after hours message pt needs ekg today due to a-fib, called at 823am n/a left message , told to forward to nurse

## 2011-10-27 NOTE — Progress Notes (Signed)
Pt here for EKG as requested by dr Clifton James, pt is in atrial fib with rate 109. Per dr Clifton James pt will increase metoprolol tart to 100 mg bid. He will see dr Clifton James tomorrow at 2pm.

## 2011-10-27 NOTE — Telephone Encounter (Signed)
Pt scheduled for EKG with the nurse at 10 am

## 2011-10-28 ENCOUNTER — Ambulatory Visit (INDEPENDENT_AMBULATORY_CARE_PROVIDER_SITE_OTHER): Payer: Medicaid Other | Admitting: Cardiovascular Disease

## 2011-10-28 ENCOUNTER — Encounter: Payer: Self-pay | Admitting: Cardiovascular Disease

## 2011-10-28 VITALS — BP 105/78 | HR 92 | Ht 72.0 in | Wt 192.0 lb

## 2011-10-28 DIAGNOSIS — I4891 Unspecified atrial fibrillation: Secondary | ICD-10-CM

## 2011-10-28 NOTE — Progress Notes (Signed)
History of Present Illness: 61 y.o. WM with history of CAD diagnosed November 2012 at time of inferior STEMI, atrial fibrillation diagnosed November 2012, cardiomyopathy felt to be secondary to etoh and possibly ischemia, tobacco abuse, etoh abuse here today for cardiac follow up. He was admitted to [facility] November 2-8, 2012 with an an inferior STEMI. He was also noted to be in atrial fibrillation at the time of admission. Emergent LHC at [facility] 02/27/11 with occluded RCA treated with a Promus DES in the proximal RCA. He was also found to have 50% ostial diagonal stenosis, 70% proximal Circumflex stenosis, 80% mid Circumflex stenosis, 40% stenoses in both obtuse marginal branches and a 99% distal Circumflex stenosis in a small caliber portion of the vessel after the last OM branch. The LAD had minimal plaque disease. LVEF was 35% by LVgram but f/u echo showed LVEF 15%. He also has a h/o ETOH abuse and he was felt to be too high risk for coumadin due to this plus need for Effient with ACS/DES. Rate control was employed with metoprolol. Echo 02/27/11: EF 15%, mod LVE, mild to mod MR, mod BAE, mod RVE, mod decreased RVSF, PASP 48, mild to mod pulmo HTN. Biventricular failure felt to be out of proportion to CAD and suspected to have mixed ICM/NICM possibly related to ETOH. Lasix also started for systolic CHF. CHF meds were titrated. I saw him 03/26/11 for f/u and we discussed staging of other vessels. He wanted to wait until after Christmas. I brought him in January 16,2013 and placed drug eluting stents in the proximal and mid Circumflex and in the distal RCA. He has stopped smoking cigarettes and drinking alcohol. I started him on coumadin at his visit here on 05/29/11. He has been following in the coumadin clinic. DCCV in March 2013. He converted to NSR and maintained NSR. Last seen here in April 2013. Echo August 17, 2011 with Resolution of LV function with LVEF of 50-55%.   He called our office 2 days  ago c/o palpitations. He was feeling well. EKG in nurse visit yesterday with atrial fibrillation with rate of 109 bpm. His Lopressor was increased to 100 mg po BID. He is here today to see me for follow up. 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 He felt a little dizzy but for the most part has not felt poorly.    Primary Care Physician: None  Last Lipid Profile:  Lipid Panel     Component Value Date/Time   CHOL 131 06/29/2011 1007   TRIG 112.0 06/29/2011 1007   HDL 36.60* 06/29/2011 1007   CHOLHDL 4 06/29/2011 1007   VLDL 22.4 06/29/2011 1007   LDLCALC 72 06/29/2011 1007     Past Medical History  Diagnosis Date  . CAD (coronary artery disease)     Inf STEMI 11/12: LHC at [facility] 02/27/11: oDx 50%, pCFX 70%, mCFX 80%, OM1 40%, oOM2 40%, dCFX 99% (small - 2mm), pRCA occluded, EF 35%.  He underwent PCI with Promus DES to the pRCA. s/p planned PCI w/ DES to prox Lcx, DES to mid LCx, DES to distal RCA 05/13/11.  . Ischemic cardiomyopathy     Echo 02/27/11: EF 15%, mod LVE, mild to mod MR, mod BAE, mod RVE, mod decreased RVSF, PASP 48, mild to mod pulmo HTN.  Marland Kitchen Chronic systolic heart failure   . Campath-induced atrial fibrillation     On coumadin  . Alcohol abuse   .  Tobacco abuse   . HLD (hyperlipidemia)   . Hemorrhoids   . HTN (hypertension)   . Chronic lower back pain     "sciatic nerve"    Past Surgical History  Procedure Date  . Back surgery ~ 1987    L4-5 ruptured disc  . Coronary angioplasty with stent placement 02/27/11    "1"  . Coronary angioplasty with stent placement 05/13/11    "3"  . Cardioversion 07/02/2011    Procedure: CARDIOVERSION;  Surgeon: Kathleene Hazel, MD;  Location: [facility] OR;  Service: Cardiovascular;  Laterality: N/A;    Current Outpatient Prescriptions  Medication Sig Dispense Refill  . atorvastatin (LIPITOR) 80 MG tablet Take 1 tablet (80 mg total) by mouth daily.  30 tablet  6  . isosorbide mononitrate  (IMDUR) 30 MG 24 hr tablet Take 1 tablet (30 mg total) by mouth daily.  30 tablet  11  . lisinopril (PRINIVIL,ZESTRIL) 5 MG tablet Take one tablet by mouth twice daily  60 tablet  11  . nitroGLYCERIN (NITROSTAT) 0.4 MG SL tablet Place 0.4 mg under the tongue every 5 (five) minutes as needed. For chest pain      . prasugrel (EFFIENT) 10 MG TABS Take 1 tablet (10 mg total) by mouth daily.  30 tablet  6  . spironolactone (ALDACTONE) 25 MG tablet Take 1 tablet (25 mg total) by mouth daily.  30 tablet  6  . warfarin (COUMADIN) 5 MG tablet Take as directed by anticoagulation clinic  30 tablet  3  . metoprolol (LOPRESSOR) 100 MG tablet Take 100 mg by mouth 2 (two) times daily.      Marland Kitchen DISCONTD: metoprolol (LOPRESSOR) 100 MG tablet Take 0.5 tablets (50 mg total) by mouth 2 (two) times daily.  30 tablet  6    No Known Allergies  History   Social History  . Marital Status: Single    Spouse Name: N/A    Number of Children: N/A  . Years of Education: N/A   Occupational History  . Not on file.   Social History Main Topics  . Smoking status: Former Smoker -- 1.0 packs/day for 44 years    Types: Cigarettes    Quit date: 02/27/2011  . Smokeless tobacco: Former Neurosurgeon    Types: Chew    Quit date: 02/27/2011  . Alcohol Use: Yes     "stopped drinking alcohol 02/27/11; had been drinking 1 mixed drink qd 365 days/yr""  . Drug Use: Yes    Special: Marijuana     "used pot ages [age]"  . Sexually Active: Yes   Other Topics Concern  . Not on file   Social History Narrative  . No narrative on file    Family History  Problem Relation Age of Onset  . Hypertension Mother   . Heart attack Mother 32  . Stroke Father     5 STROKES AND MI IN HIS 80'S  . Heart attack Father 25    Review of Systems:  As stated in the HPI and otherwise negative.   BP 105/78  Pulse 92  Ht 6' (1.829 m)  Wt 192 lb (87.091 kg)  BMI 26.04 kg/m2  Physical Examination: General: Well developed, well nourished,  NAD HEENT: OP clear, mucus membranes moist SKIN: warm, dry. No rashes. Neuro: No focal deficits Musculoskeletal: Muscle strength 5/5 all ext Psychiatric: Mood and affect normal Neck: No JVD, no carotid bruits, no thyromegaly, no lymphadenopathy. Lungs:Clear bilaterally, no wheezes, rhonci, crackles Cardiovascular: Irregular rate and  rhythm. No murmurs, gallops or rubs. Abdomen:Soft. Bowel sounds present. Non-tender.  Extremities: No lower extremity edema. Pulses are 2 + in the bilateral DP/PT.  EKG: 10/28/11: Atrial fibrillation. Non-specific T wave abnormalities.

## 2011-10-28 NOTE — Patient Instructions (Addendum)
Your physician recommends that you schedule a follow-up appointment on December 03, 2011

## 2011-10-28 NOTE — Assessment & Plan Note (Signed)
He is rate controlled today. His INR is therapeutic. Will continue coumadin. Will continue increased dose of Lopressor. I have discussed cardioversion but he wishes to hold off for now as he feels well. I will see him in one month and if he is still in atrial fibrillation, will consider DCCV. He may need EP referral as well if he does not maintain NSR.

## 2011-11-04 ENCOUNTER — Ambulatory Visit (INDEPENDENT_AMBULATORY_CARE_PROVIDER_SITE_OTHER): Payer: Medicaid Other | Admitting: *Deleted

## 2011-11-04 ENCOUNTER — Other Ambulatory Visit: Payer: Self-pay | Admitting: *Deleted

## 2011-11-04 DIAGNOSIS — E785 Hyperlipidemia, unspecified: Secondary | ICD-10-CM

## 2011-11-04 DIAGNOSIS — Z7901 Long term (current) use of anticoagulants: Secondary | ICD-10-CM

## 2011-11-04 DIAGNOSIS — I4891 Unspecified atrial fibrillation: Secondary | ICD-10-CM

## 2011-11-04 LAB — POCT INR: INR: 2.2

## 2011-11-04 MED ORDER — ATORVASTATIN CALCIUM 80 MG PO TABS: 80.0000 mg | ORAL_TABLET | Freq: Every day | ORAL | Status: DC

## 2011-11-04 MED ORDER — SPIRONOLACTONE 25 MG PO TABS: 25.0000 mg | ORAL_TABLET | Freq: Every day | ORAL | Status: DC

## 2011-11-25 ENCOUNTER — Telehealth: Payer: Self-pay | Admitting: *Deleted

## 2011-11-25 NOTE — Telephone Encounter (Signed)
Message left on pt's identified voice mail that Effient received in office (pt in assistance program) and would be at front desk for him to pick up. Left message to call back if any questions.

## 2011-12-03 ENCOUNTER — Ambulatory Visit (INDEPENDENT_AMBULATORY_CARE_PROVIDER_SITE_OTHER): Payer: Self-pay | Admitting: Cardiovascular Disease

## 2011-12-03 ENCOUNTER — Ambulatory Visit (INDEPENDENT_AMBULATORY_CARE_PROVIDER_SITE_OTHER): Payer: Self-pay | Admitting: Pharmacist

## 2011-12-03 ENCOUNTER — Encounter: Payer: Self-pay | Admitting: Cardiovascular Disease

## 2011-12-03 VITALS — BP 134/94 | HR 90 | Ht 72.0 in | Wt 186.6 lb

## 2011-12-03 DIAGNOSIS — I4891 Unspecified atrial fibrillation: Secondary | ICD-10-CM

## 2011-12-03 DIAGNOSIS — I2589 Other forms of chronic ischemic heart disease: Secondary | ICD-10-CM

## 2011-12-03 DIAGNOSIS — M79609 Pain in unspecified limb: Secondary | ICD-10-CM

## 2011-12-03 DIAGNOSIS — Z7901 Long term (current) use of anticoagulants: Secondary | ICD-10-CM

## 2011-12-03 DIAGNOSIS — M79604 Pain in right leg: Secondary | ICD-10-CM | POA: Insufficient documentation

## 2011-12-03 DIAGNOSIS — I255 Ischemic cardiomyopathy: Secondary | ICD-10-CM

## 2011-12-03 DIAGNOSIS — I2510 Atherosclerotic heart disease of native coronary artery without angina pectoris: Secondary | ICD-10-CM

## 2011-12-03 LAB — POCT INR: INR: 3

## 2011-12-03 MED ORDER — OXYCODONE-ACETAMINOPHEN 5-325 MG PO TABS: 1.0000 | ORAL_TABLET | Freq: Four times a day (QID) | ORAL | Status: AC | PRN

## 2011-12-03 NOTE — Assessment & Plan Note (Signed)
Stable No changes 

## 2011-12-03 NOTE — Assessment & Plan Note (Signed)
?   Etiology. Normal ABI in January 2013. ? Right groin pain could be pseudoaneurysm. Will arrange right groin u/s to exclude this. Will give percocet prn for pain.

## 2011-12-03 NOTE — Assessment & Plan Note (Signed)
Rate controlled. Continue current therapy including coumadin. He does not wish to try DCCV again as he feels he is going in/out of rhythm at home.

## 2011-12-03 NOTE — Progress Notes (Signed)
History of Present Illness: 61 y.o. WM with history of CAD diagnosed November 2012 at time of inferior STEMI, atrial fibrillation diagnosed November 2012, cardiomyopathy felt to be secondary to etoh and possibly ischemia, tobacco abuse, etoh abuse here today for cardiac follow up. He was admitted to [facility] November 2-8, 2012 with an an inferior STEMI. He was also noted to be in atrial fibrillation at the time of admission. Emergent LHC at [facility] 02/27/11 with occluded RCA treated with a Promus DES in the proximal RCA. He was also found to have 50% ostial diagonal stenosis, 70% proximal Circumflex stenosis, 80% mid Circumflex stenosis, 40% stenoses in both obtuse marginal branches and a 99% distal Circumflex stenosis in a small caliber portion of the vessel after the last OM branch. The LAD had minimal plaque disease. LVEF was 35% by LVgram but f/u echo showed LVEF 15%. He also has a h/o ETOH abuse and he was felt to be too high risk for coumadin due to this plus need for Effient with ACS/DES. Rate control was employed with metoprolol. Echo 02/27/11: EF 15%, mod LVE, mild to mod MR, mod BAE, mod RVE, mod decreased RVSF, PASP 48, mild to mod pulmo HTN. Biventricular failure felt to be out of proportion to CAD and suspected to have mixed ICM/NICM possibly related to ETOH. Lasix also started for systolic CHF. CHF meds were titrated. I saw him 03/26/11 for f/u and we discussed staging of other vessels. He wanted to wait until after Christmas. I brought him in January 16,2013 and placed drug eluting stents in the proximal and mid Circumflex and in the distal RCA. He has stopped smoking cigarettes and drinking alcohol. I started him on coumadin at his visit here on 05/29/11. He has been following in the coumadin clinic. DCCV in March 2013. He converted to NSR and maintained NSR. Echo August 17, 2011 with Resolution of LV function with LVEF of 50-55%. He began to feel palpitations and was seen in July 2013 and had  converted to atrial fibrillation. I saw him on 10/28/11 and he was in atrial fibrillation but he did not want to undergo cardioversion at that time.   He tells me today that he is feeling ok but he has been having some pain in both legs over last few weeks. This has started after he stepped on a rock. Normal ABI in January 2013. This is constant pain which radiates from his back. No ulcerations. No chest pain or SOB. No dizziness near syncope or syncope.    Primary Care Physician: None  Last Lipid Profile:  Lipid Panel     Component Value Date/Time   CHOL 131 06/29/2011 1007   TRIG 112.0 06/29/2011 1007   HDL 36.60* 06/29/2011 1007   CHOLHDL 4 06/29/2011 1007   VLDL 22.4 06/29/2011 1007   LDLCALC 72 06/29/2011 1007     Past Medical History  Diagnosis Date  . CAD (coronary artery disease)     Inf STEMI 11/12: LHC at [facility] 02/27/11: oDx 50%, pCFX 70%, mCFX 80%, OM1 40%, oOM2 40%, dCFX 99% (small - 2mm), pRCA occluded, EF 35%.  He underwent PCI with Promus DES to the pRCA. s/p planned PCI w/ DES to prox Lcx, DES to mid LCx, DES to distal RCA 05/13/11.  . Ischemic cardiomyopathy     Echo 02/27/11: EF 15%, mod LVE, mild to mod MR, mod BAE, mod RVE, mod decreased RVSF, PASP 48, mild to mod pulmo HTN.  Marland Kitchen Chronic systolic heart  failure   . Atrial fibrillation     On coumadin  . Alcohol abuse   . Tobacco abuse   . HLD (hyperlipidemia)   . Hemorrhoids   . HTN (hypertension)   . Chronic lower back pain     "sciatic nerve"    Past Surgical History  Procedure Date  . Back surgery ~ 1987    L4-5 ruptured disc  . Coronary angioplasty with stent placement 02/27/11    "1"  . Coronary angioplasty with stent placement 05/13/11    "3"  . Cardioversion 07/02/2011    Procedure: CARDIOVERSION;  Surgeon: Kathleene Hazel, MD;  Location: [facility] OR;  Service: Cardiovascular;  Laterality: N/A;    Current Outpatient Prescriptions  Medication Sig Dispense Refill  . atorvastatin (LIPITOR) 80 MG tablet Take 1  tablet (80 mg total) by mouth daily.  30 tablet  6  . isosorbide mononitrate (IMDUR) 30 MG 24 hr tablet Take 1 tablet (30 mg total) by mouth daily.  30 tablet  11  . lisinopril (PRINIVIL,ZESTRIL) 5 MG tablet Take one tablet by mouth twice daily  60 tablet  11  . metoprolol (LOPRESSOR) 100 MG tablet Take 100 mg by mouth 2 (two) times daily.      . nitroGLYCERIN (NITROSTAT) 0.4 MG SL tablet Place 0.4 mg under the tongue every 5 (five) minutes as needed. For chest pain      . prasugrel (EFFIENT) 10 MG TABS Take 1 tablet (10 mg total) by mouth daily.  30 tablet  6  . spironolactone (ALDACTONE) 25 MG tablet Take 1 tablet (25 mg total) by mouth daily.  30 tablet  6  . warfarin (COUMADIN) 5 MG tablet Take as directed by anticoagulation clinic  30 tablet  3    No Known Allergies  History   Social History  . Marital Status: Single    Spouse Name: N/A    Number of Children: N/A  . Years of Education: N/A   Occupational History  . Not on file.   Social History Main Topics  . Smoking status: Former Smoker -- 1.0 packs/day for 44 years    Types: Cigarettes    Quit date: 02/27/2011  . Smokeless tobacco: Former Neurosurgeon    Types: Chew    Quit date: 02/27/2011  . Alcohol Use: Yes     "stopped drinking alcohol 02/27/11; had been drinking 1 mixed drink qd 365 days/yr""  . Drug Use: Yes    Special: Marijuana     "used pot ages [age]"  . Sexually Active: Yes   Other Topics Concern  . Not on file   Social History Narrative  . No narrative on file    Family History  Problem Relation Age of Onset  . Hypertension Mother   . Heart attack Mother 45  . Stroke Father     5 STROKES AND MI IN HIS 80'S  . Heart attack Father 40    Review of Systems:  As stated in the HPI and otherwise negative.   BP 134/94  Pulse 90  Ht 6' (1.829 m)  Wt 186 lb 9.6 oz (84.641 kg)  BMI 25.31 kg/m2  Physical Examination: General: Well developed, well nourished, NAD HEENT: OP clear, mucus membranes  moist SKIN: warm, dry. No rashes. Neuro: No focal deficits Musculoskeletal: Muscle strength 5/5 all ext Psychiatric: Mood and affect normal Neck: No JVD, no carotid bruits, no thyromegaly, no lymphadenopathy. Lungs:Clear bilaterally, no wheezes, rhonci, crackles Cardiovascular: Irregular  rate and rhythm. No murmurs,  gallops or rubs. Abdomen:Soft. Bowel sounds present. Non-tender.  Extremities: No lower extremity edema. Pulses are 1 + in the bilateral DP/PT.   EKG:  Atrial fibrillation, rate 90 bpm. T wave inversions inferiorly.

## 2011-12-03 NOTE — Patient Instructions (Addendum)
Your physician recommends that you schedule a follow-up appointment in: 3 weeks.   Your physician has requested that you have a lower or upper extremity arterial duplex. This test is an ultrasound of the arteries in the legs or arms. It looks at arterial blood flow in the legs and arms. Allow one hour for Lower and Upper Arterial scans. There are no restrictions or special instructions

## 2011-12-03 NOTE — Assessment & Plan Note (Signed)
Resolved. On good therapy.

## 2011-12-07 ENCOUNTER — Ambulatory Visit: Payer: Medicaid Other | Admitting: Cardiovascular Disease

## 2011-12-07 NOTE — Addendum Note (Signed)
Addended by: Andrey Cota A on: 12/07/2011 02:46 PM   Modules accepted: Orders

## 2011-12-29 ENCOUNTER — Other Ambulatory Visit: Payer: Self-pay | Admitting: Cardiovascular Disease

## 2011-12-29 DIAGNOSIS — I4891 Unspecified atrial fibrillation: Secondary | ICD-10-CM

## 2011-12-29 MED ORDER — WARFARIN SODIUM 5 MG PO TABS: ORAL_TABLET | ORAL | Status: DC

## 2012-01-01 ENCOUNTER — Ambulatory Visit (INDEPENDENT_AMBULATORY_CARE_PROVIDER_SITE_OTHER): Payer: Self-pay | Admitting: *Deleted

## 2012-01-01 ENCOUNTER — Ambulatory Visit: Payer: Self-pay | Admitting: Cardiovascular Disease

## 2012-01-01 DIAGNOSIS — Z7901 Long term (current) use of anticoagulants: Secondary | ICD-10-CM

## 2012-01-01 DIAGNOSIS — I4891 Unspecified atrial fibrillation: Secondary | ICD-10-CM

## 2012-01-01 LAB — POCT INR: INR: 3.1

## 2012-01-27 ENCOUNTER — Telehealth: Payer: Self-pay | Admitting: *Deleted

## 2012-01-27 NOTE — Telephone Encounter (Signed)
Pt due to have liver function tests done in October. Can be done at Coumadin clinic appt on January 29, 2012. I called and gave pt this information

## 2012-01-29 ENCOUNTER — Ambulatory Visit (INDEPENDENT_AMBULATORY_CARE_PROVIDER_SITE_OTHER): Payer: Self-pay | Admitting: *Deleted

## 2012-01-29 ENCOUNTER — Other Ambulatory Visit (INDEPENDENT_AMBULATORY_CARE_PROVIDER_SITE_OTHER): Payer: Self-pay

## 2012-01-29 ENCOUNTER — Encounter: Payer: Self-pay | Admitting: Cardiovascular Disease

## 2012-01-29 ENCOUNTER — Telehealth: Payer: Self-pay | Admitting: *Deleted

## 2012-01-29 ENCOUNTER — Other Ambulatory Visit: Payer: Self-pay | Admitting: Cardiology

## 2012-01-29 ENCOUNTER — Other Ambulatory Visit: Payer: Self-pay | Admitting: *Deleted

## 2012-01-29 DIAGNOSIS — E785 Hyperlipidemia, unspecified: Secondary | ICD-10-CM

## 2012-01-29 DIAGNOSIS — I4891 Unspecified atrial fibrillation: Secondary | ICD-10-CM

## 2012-01-29 DIAGNOSIS — Z7901 Long term (current) use of anticoagulants: Secondary | ICD-10-CM

## 2012-01-29 LAB — HEPATIC FUNCTION PANEL
Albumin: 3.8 g/dL (ref 3.5–5.2)
Total Protein: 6.6 g/dL (ref 6.0–8.3)

## 2012-01-29 LAB — POCT INR: INR: 1.9

## 2012-01-29 MED ORDER — METOPROLOL TARTRATE 100 MG PO TABS: 100.0000 mg | ORAL_TABLET | Freq: Two times a day (BID) | ORAL | Status: DC

## 2012-01-29 MED ORDER — ATORVASTATIN CALCIUM 80 MG PO TABS: 80.0000 mg | ORAL_TABLET | Freq: Every day | ORAL | Status: DC

## 2012-01-29 NOTE — Telephone Encounter (Signed)
Pt has been notified he is to testify in court regarding a person who drove across his yard.  Pt is requesting a note from Dr. Clifton James excusing him from court due to his cardiac condition and the stress it would cause him to testify.  He states when he is under stressful conditions he has palpitations and he could feel his heart racing when he last had to appear in court. I told pt I would send message to Dr. Clifton James to see if he could write note.

## 2012-01-29 NOTE — Telephone Encounter (Signed)
Done. Letter written. Thanks,chris

## 2012-01-29 NOTE — Telephone Encounter (Signed)
Pt notified and letter mailed to him.

## 2012-01-29 NOTE — Telephone Encounter (Signed)
Fax Received. Refill Completed. Zella Dewan Chowoe (R.M.A)   

## 2012-02-02 ENCOUNTER — Other Ambulatory Visit: Payer: Self-pay

## 2012-02-26 ENCOUNTER — Ambulatory Visit (INDEPENDENT_AMBULATORY_CARE_PROVIDER_SITE_OTHER): Payer: Self-pay

## 2012-02-26 DIAGNOSIS — I4891 Unspecified atrial fibrillation: Secondary | ICD-10-CM

## 2012-02-26 DIAGNOSIS — Z7901 Long term (current) use of anticoagulants: Secondary | ICD-10-CM

## 2012-02-26 LAB — POCT INR: INR: 2.5

## 2012-03-29 ENCOUNTER — Telehealth: Payer: Self-pay

## 2012-03-29 NOTE — Telephone Encounter (Signed)
Pt fell in the bed of his truck and hit the side of his left leg between his knee and his hip at 10 am this am.  Pt states a goose egg immediately appeared after fall, but doesn't think he broke anything. Elevated leg and applied cold compresses x 2 hrs.  Pt reports thigh continues to swell, no visible signs of bruising at present, but thigh is hard and tight at present.  Pt reports difficulty walking on or moving.  Advised pt to go to the ED or Urgent care for assessment of injury and treatment by MD.

## 2012-04-01 ENCOUNTER — Ambulatory Visit (INDEPENDENT_AMBULATORY_CARE_PROVIDER_SITE_OTHER): Payer: Self-pay

## 2012-04-01 DIAGNOSIS — Z7901 Long term (current) use of anticoagulants: Secondary | ICD-10-CM

## 2012-04-01 DIAGNOSIS — I4891 Unspecified atrial fibrillation: Secondary | ICD-10-CM

## 2012-04-13 ENCOUNTER — Ambulatory Visit (INDEPENDENT_AMBULATORY_CARE_PROVIDER_SITE_OTHER): Payer: Medicaid Other | Admitting: *Deleted

## 2012-04-13 ENCOUNTER — Ambulatory Visit (INDEPENDENT_AMBULATORY_CARE_PROVIDER_SITE_OTHER): Payer: Medicaid Other | Admitting: Cardiovascular Disease

## 2012-04-13 ENCOUNTER — Encounter: Payer: Self-pay | Admitting: Cardiovascular Disease

## 2012-04-13 VITALS — BP 118/82 | HR 67 | Ht 72.0 in | Wt 190.0 lb

## 2012-04-13 DIAGNOSIS — Z7901 Long term (current) use of anticoagulants: Secondary | ICD-10-CM

## 2012-04-13 DIAGNOSIS — I4891 Unspecified atrial fibrillation: Secondary | ICD-10-CM

## 2012-04-13 DIAGNOSIS — I2510 Atherosclerotic heart disease of native coronary artery without angina pectoris: Secondary | ICD-10-CM

## 2012-04-13 LAB — POCT INR: INR: 2.7

## 2012-04-13 MED ORDER — ASPIRIN EC 81 MG PO TBEC: 81.0000 mg | DELAYED_RELEASE_TABLET | Freq: Every day | ORAL | Status: AC

## 2012-04-13 NOTE — Patient Instructions (Addendum)
Your physician wants you to follow-up in:  6 months.You will receive a reminder letter in the mail two months in advance. If you don't receive a letter, please call our office to schedule the follow-up appointment. Fasting lab work will be done at this appt.  Your physician has recommended you make the following change in your medication:  Stop Effient.  Start aspirin 81 mg by mouth daily

## 2012-04-13 NOTE — Progress Notes (Signed)
History of Present Illness: 61 y.o. WM with history of CAD diagnosed November 2012 at time of inferior STEMI, atrial fibrillation diagnosed November 2012, cardiomyopathy felt to be secondary to etoh and possibly ischemia, tobacco abuse, etoh abuse here today for cardiac follow up. He was admitted to [facility] November 2-8, 2012 with an an inferior STEMI. He was also noted to be in atrial fibrillation at the time of admission. Emergent LHC at [facility] 02/27/11 with occluded RCA treated with a Promus DES in the proximal RCA. He was also found to have 50% ostial diagonal stenosis, 70% proximal Circumflex stenosis, 80% mid Circumflex stenosis, 40% stenoses in both obtuse marginal branches and a 99% distal Circumflex stenosis in a small caliber portion of the vessel after the last OM branch. The LAD had minimal plaque disease. LVEF was 35% by LVgram but f/u echo showed LVEF 15%. He also had a h/o ETOH abuse and he was felt to be too high risk for coumadin due to this plus need for Effient with ACS/DES. Rate control was employed with metoprolol. Echo 02/27/11: EF 15%, mod LVE, mild to mod MR, mod BAE, mod RVE, mod decreased RVSF, PASP 48, mild to mod pulmo HTN. Biventricular failure felt to be out of proportion to CAD and suspected to have mixed ICM/NICM possibly related to ETOH. Lasix also started for systolic CHF. CHF meds were titrated. I saw him 03/26/11 for f/u and we discussed staging of other vessels. He wanted to wait until after Christmas. I brought him in January 16,2013 and placed drug eluting stents in the proximal and mid Circumflex and in the distal RCA. He stopped smoking cigarettes and drinking alcohol. I started him on coumadin at his visit  on 05/29/11. He has been following in the coumadin clinic. DCCV in March 2013. He converted to NSR and maintained NSR but converted back to atrial fibrillation in July 2013 but did not want DCCV. Echo August 17, 2011 with Resolution of LV function with LVEF of  50-55%. Normal ABI in January 2013.  He is here today for follow up. No chest pain or SOB. No dizziness near syncope or syncope. He is hunting and having no issues with exertion. He is not aware of any palpitations.   Primary Care Physician: None  Last Lipid Profile:Lipid Panel     Component Value Date/Time   CHOL 131 06/29/2011 1007   TRIG 112.0 06/29/2011 1007   HDL 36.60* 06/29/2011 1007   CHOLHDL 4 06/29/2011 1007   VLDL 22.4 06/29/2011 1007   LDLCALC 72 06/29/2011 1007    Past Medical History  Diagnosis Date  . CAD (coronary artery disease)     Inf STEMI 11/12: LHC at [facility] 02/27/11: oDx 50%, pCFX 70%, mCFX 80%, OM1 40%, oOM2 40%, dCFX 99% (small - 2mm), pRCA occluded, EF 35%.  He underwent PCI with Promus DES to the pRCA. s/p planned PCI w/ DES to prox Lcx, DES to mid LCx, DES to distal RCA 05/13/11.  . Ischemic cardiomyopathy     Echo 02/27/11: EF 15%, mod LVE, mild to mod MR, mod BAE, mod RVE, mod decreased RVSF, PASP 48, mild to mod pulmo HTN.  Marland Kitchen Chronic systolic heart failure   . Atrial fibrillation     On coumadin  . Alcohol abuse   . Tobacco abuse   . HLD (hyperlipidemia)   . Hemorrhoids   . HTN (hypertension)   . Chronic lower back pain     "sciatic nerve"  Past Surgical History  Procedure Date  . Back surgery ~ 1987    L4-5 ruptured disc  . Coronary angioplasty with stent placement 02/27/11    "1"  . Coronary angioplasty with stent placement 05/13/11    "3"  . Cardioversion 07/02/2011    Procedure: CARDIOVERSION;  Surgeon: Kathleene Hazel, MD;  Location: [facility] OR;  Service: Cardiovascular;  Laterality: N/A;    Current Outpatient Prescriptions  Medication Sig Dispense Refill  . atorvastatin (LIPITOR) 80 MG tablet Take 1 tablet (80 mg total) by mouth daily.  30 tablet  6  . isosorbide mononitrate (IMDUR) 30 MG 24 hr tablet Take 1 tablet (30 mg total) by mouth daily.  30 tablet  11  . lisinopril (PRINIVIL,ZESTRIL) 5 MG tablet Take one tablet by mouth twice daily  60  tablet  11  . metoprolol (LOPRESSOR) 100 MG tablet Take 1 tablet (100 mg total) by mouth 2 (two) times daily.  60 tablet  5  . nitroGLYCERIN (NITROSTAT) 0.4 MG SL tablet Place 0.4 mg under the tongue every 5 (five) minutes as needed. For chest pain      . prasugrel (EFFIENT) 10 MG TABS Take 1 tablet (10 mg total) by mouth daily.  30 tablet  6  . spironolactone (ALDACTONE) 25 MG tablet Take 1 tablet (25 mg total) by mouth daily.  30 tablet  6  . warfarin (COUMADIN) 5 MG tablet Take as directed by anticoagulation clinic  30 tablet  3    No Known Allergies  History   Social History  . Marital Status: Single    Spouse Name: N/A    Number of Children: N/A  . Years of Education: N/A   Occupational History  . Not on file.   Social History Main Topics  . Smoking status: Former Smoker -- 1.0 packs/day for 44 years    Types: Cigarettes    Quit date: 02/27/2011  . Smokeless tobacco: Former Neurosurgeon    Types: Chew    Quit date: 02/27/2011  . Alcohol Use: Yes     Comment: "stopped drinking alcohol 02/27/11; had been drinking 1 mixed drink qd 365 days/yr""  . Drug Use: Yes    Special: Marijuana     Comment: "used pot ages [age]"  . Sexually Active: Yes   Other Topics Concern  . Not on file   Social History Narrative  . No narrative on file    Family History  Problem Relation Age of Onset  . Hypertension Mother   . Heart attack Mother 2  . Stroke Father     5 STROKES AND MI IN HIS 80'S  . Heart attack Father 25    Review of Systems:  As stated in the HPI and otherwise negative.   BP 118/82  Pulse 67  Ht 6' (1.829 m)  Wt 190 lb (86.183 kg)  BMI 25.77 kg/m2  Physical Examination: General: Well developed, well nourished, NAD HEENT: OP clear, mucus membranes moist SKIN: warm, dry. No rashes. Neuro: No focal deficits Musculoskeletal: Muscle strength 5/5 all ext Psychiatric: Mood and affect normal Neck: No JVD, no carotid bruits, no thyromegaly, no  lymphadenopathy. Lungs:Clear bilaterally, no wheezes, rhonci, crackles Cardiovascular: Irregular irregular. No murmurs, gallops or rubs. Abdomen:Soft. Bowel sounds present. Non-tender.  Extremities: No lower extremity edema. Pulses are 2 + in the bilateral DP/PT. Large ecchymosis left leg, evolving.   EKG: Atrial fibrillation, rate 63 bpm.   Echo 08/17/11: Left ventricle: The cavity size was normal. Wall thickness was increased  in a pattern of mild LVH. Systolic function was normal. The estimated ejection fraction was in the range of 50% to 55%. Doppler parameters are consistent with abnormal left ventricular relaxation (grade 1 diastolic dysfunction). - Mitral valve: Mild regurgitation.  Assessment and Plan:   1. Atrial fibrillation: Rate controlled. Continue current therapy including coumadin. He does not wish to try DCCV again at that time.    2. CAD: Stable. No changes. Will stop Effient for now since it has been one year since his stents were placed. Will start ASA 81 mg po QDaily. Continue  Beta blocker, Imdur, statin, Ace-inh  3. Ischemic cardiomyopathy: Resolved. On good therapy. No changes in therapy.

## 2012-05-04 ENCOUNTER — Ambulatory Visit (INDEPENDENT_AMBULATORY_CARE_PROVIDER_SITE_OTHER): Payer: Self-pay | Admitting: Pharmacist

## 2012-05-04 DIAGNOSIS — Z7901 Long term (current) use of anticoagulants: Secondary | ICD-10-CM

## 2012-05-04 DIAGNOSIS — I4891 Unspecified atrial fibrillation: Secondary | ICD-10-CM

## 2012-05-04 LAB — POCT INR: INR: 2.9

## 2012-05-10 ENCOUNTER — Other Ambulatory Visit: Payer: Self-pay

## 2012-05-10 DIAGNOSIS — I4891 Unspecified atrial fibrillation: Secondary | ICD-10-CM

## 2012-05-10 MED ORDER — WARFARIN SODIUM 5 MG PO TABS: ORAL_TABLET | ORAL | Status: DC

## 2012-06-01 ENCOUNTER — Ambulatory Visit (INDEPENDENT_AMBULATORY_CARE_PROVIDER_SITE_OTHER): Payer: Self-pay | Admitting: *Deleted

## 2012-06-01 ENCOUNTER — Other Ambulatory Visit: Payer: Self-pay | Admitting: *Deleted

## 2012-06-01 DIAGNOSIS — I4891 Unspecified atrial fibrillation: Secondary | ICD-10-CM

## 2012-06-01 DIAGNOSIS — Z7901 Long term (current) use of anticoagulants: Secondary | ICD-10-CM

## 2012-06-01 MED ORDER — SPIRONOLACTONE 25 MG PO TABS: 25.0000 mg | ORAL_TABLET | Freq: Every day | ORAL | Status: DC

## 2012-06-22 ENCOUNTER — Ambulatory Visit (INDEPENDENT_AMBULATORY_CARE_PROVIDER_SITE_OTHER): Payer: Self-pay | Admitting: *Deleted

## 2012-06-22 DIAGNOSIS — Z7901 Long term (current) use of anticoagulants: Secondary | ICD-10-CM

## 2012-06-22 DIAGNOSIS — I4891 Unspecified atrial fibrillation: Secondary | ICD-10-CM

## 2012-07-14 ENCOUNTER — Ambulatory Visit (INDEPENDENT_AMBULATORY_CARE_PROVIDER_SITE_OTHER): Payer: Self-pay | Admitting: *Deleted

## 2012-07-14 DIAGNOSIS — Z7901 Long term (current) use of anticoagulants: Secondary | ICD-10-CM

## 2012-07-14 DIAGNOSIS — I4891 Unspecified atrial fibrillation: Secondary | ICD-10-CM

## 2012-07-14 LAB — POCT INR: INR: 3.5

## 2012-07-28 ENCOUNTER — Ambulatory Visit (INDEPENDENT_AMBULATORY_CARE_PROVIDER_SITE_OTHER): Payer: Self-pay

## 2012-07-28 DIAGNOSIS — Z7901 Long term (current) use of anticoagulants: Secondary | ICD-10-CM

## 2012-07-28 DIAGNOSIS — I4891 Unspecified atrial fibrillation: Secondary | ICD-10-CM

## 2012-07-28 LAB — POCT INR: INR: 2.6

## 2012-08-02 ENCOUNTER — Other Ambulatory Visit: Payer: Self-pay | Admitting: Cardiology

## 2012-08-02 ENCOUNTER — Other Ambulatory Visit: Payer: Self-pay | Admitting: *Deleted

## 2012-08-02 DIAGNOSIS — E785 Hyperlipidemia, unspecified: Secondary | ICD-10-CM

## 2012-08-02 MED ORDER — METOPROLOL TARTRATE 100 MG PO TABS: 100.0000 mg | ORAL_TABLET | Freq: Two times a day (BID) | ORAL | Status: DC

## 2012-08-02 MED ORDER — ATORVASTATIN CALCIUM 80 MG PO TABS: 80.0000 mg | ORAL_TABLET | Freq: Every day | ORAL | Status: DC

## 2012-08-02 NOTE — Telephone Encounter (Signed)
Fax Received. Refill Completed. Andres Ward (R.M.A)   

## 2012-08-11 ENCOUNTER — Other Ambulatory Visit: Payer: Self-pay

## 2012-08-11 MED ORDER — NITROGLYCERIN 0.4 MG SL SUBL: 0.4000 mg | SUBLINGUAL_TABLET | SUBLINGUAL | Status: DC | PRN

## 2012-08-18 ENCOUNTER — Ambulatory Visit (INDEPENDENT_AMBULATORY_CARE_PROVIDER_SITE_OTHER): Payer: Self-pay

## 2012-08-18 DIAGNOSIS — I4891 Unspecified atrial fibrillation: Secondary | ICD-10-CM

## 2012-08-18 DIAGNOSIS — Z7901 Long term (current) use of anticoagulants: Secondary | ICD-10-CM

## 2012-08-18 LAB — POCT INR: INR: 2.7

## 2012-09-15 ENCOUNTER — Other Ambulatory Visit: Payer: Self-pay

## 2012-09-15 ENCOUNTER — Ambulatory Visit (INDEPENDENT_AMBULATORY_CARE_PROVIDER_SITE_OTHER): Payer: Self-pay

## 2012-09-15 DIAGNOSIS — Z7901 Long term (current) use of anticoagulants: Secondary | ICD-10-CM

## 2012-09-15 DIAGNOSIS — I2510 Atherosclerotic heart disease of native coronary artery without angina pectoris: Secondary | ICD-10-CM

## 2012-09-15 DIAGNOSIS — I4891 Unspecified atrial fibrillation: Secondary | ICD-10-CM

## 2012-09-15 LAB — POCT INR: INR: 2

## 2012-09-15 MED ORDER — LISINOPRIL 5 MG PO TABS: ORAL_TABLET | ORAL | Status: DC

## 2012-09-15 NOTE — Telephone Encounter (Signed)
Pt states he needs refill on Lisinopril 5mg  BID he is out, pharmacy loaned 3 days, needs refill sent ASAP.  [place] pharmacy 30 day supply.

## 2012-09-15 NOTE — Telephone Encounter (Signed)
Prescription refilled.

## 2012-09-21 ENCOUNTER — Other Ambulatory Visit: Payer: Self-pay | Admitting: Pharmacist

## 2012-09-21 DIAGNOSIS — I4891 Unspecified atrial fibrillation: Secondary | ICD-10-CM

## 2012-09-21 MED ORDER — WARFARIN SODIUM 5 MG PO TABS: ORAL_TABLET | ORAL | Status: DC

## 2012-10-18 ENCOUNTER — Encounter: Payer: Self-pay | Admitting: Cardiovascular Disease

## 2012-10-18 ENCOUNTER — Ambulatory Visit (INDEPENDENT_AMBULATORY_CARE_PROVIDER_SITE_OTHER): Payer: Self-pay | Admitting: *Deleted

## 2012-10-18 ENCOUNTER — Telehealth: Payer: Self-pay | Admitting: *Deleted

## 2012-10-18 ENCOUNTER — Ambulatory Visit (INDEPENDENT_AMBULATORY_CARE_PROVIDER_SITE_OTHER): Payer: Self-pay | Admitting: Cardiovascular Disease

## 2012-10-18 VITALS — BP 125/92 | HR 74 | Ht 72.0 in | Wt 198.6 lb

## 2012-10-18 DIAGNOSIS — I255 Ischemic cardiomyopathy: Secondary | ICD-10-CM

## 2012-10-18 DIAGNOSIS — N529 Male erectile dysfunction, unspecified: Secondary | ICD-10-CM

## 2012-10-18 DIAGNOSIS — I2510 Atherosclerotic heart disease of native coronary artery without angina pectoris: Secondary | ICD-10-CM

## 2012-10-18 DIAGNOSIS — I4891 Unspecified atrial fibrillation: Secondary | ICD-10-CM

## 2012-10-18 DIAGNOSIS — I2589 Other forms of chronic ischemic heart disease: Secondary | ICD-10-CM

## 2012-10-18 DIAGNOSIS — Z7901 Long term (current) use of anticoagulants: Secondary | ICD-10-CM

## 2012-10-18 LAB — LIPID PANEL
HDL: 33.3 mg/dL — ABNORMAL LOW (ref >39.00)
LDL Cholesterol: 73 mg/dL (ref 0–99)
Total CHOL/HDL Ratio: 4

## 2012-10-18 LAB — HEPATIC FUNCTION PANEL
AST: 39 U/L — ABNORMAL HIGH (ref 0–37)
Total Bilirubin: 0.9 mg/dL (ref 0.3–1.2)

## 2012-10-18 MED ORDER — ISOSORBIDE MONONITRATE ER 30 MG PO TB24: 30.0000 mg | ORAL_TABLET | Freq: Every day | ORAL | Status: DC

## 2012-10-18 MED ORDER — SILDENAFIL CITRATE 50 MG PO TABS: 50.0000 mg | ORAL_TABLET | Freq: Every day | ORAL | Status: DC | PRN

## 2012-10-18 NOTE — Telephone Encounter (Signed)
Spoke with pt. He has had problems with erectile dysfunction since MI.  He is asking if Dr. Clifton James would prescribe Viagra for him.  Med list has pt on Imdur but pt states he only took for 1 week and then stopped due to headache.  (prescribed at office visit on 06/29/11).  Also reports he has not used NTG recently and uses very infrequently.

## 2012-10-18 NOTE — Patient Instructions (Signed)
Your physician wants you to follow-up in:  6 months. You will receive a reminder letter in the mail two months in advance. If you don't receive a letter, please call our office to schedule the follow-up appointment.   

## 2012-10-18 NOTE — Telephone Encounter (Signed)
Spoke with pt and gave him instructions from Dr. Clifton James. Will send prescription to [facility]

## 2012-10-18 NOTE — Telephone Encounter (Signed)
We can start Viagra 50 mg po to use prn if he is not using Imdur or does not use SL NTG in same 24 hour period. 10 pills and 3 refills.

## 2012-10-18 NOTE — Progress Notes (Signed)
History of Present Illness: 62 y.o. WM with history of CAD diagnosed November 2012 at time of inferior STEMI, atrial fibrillation diagnosed November 2012, cardiomyopathy felt to be secondary to etoh and possibly ischemia, tobacco abuse, etoh abuse here today for cardiac follow up. He was admitted to [facility] November 2-8, 2012 with an an inferior STEMI. He was also noted to be in atrial fibrillation at the time of admission. Emergent LHC at [facility] 02/27/11 with occluded RCA treated with a Promus DES in the proximal RCA. He was also found to have 50% ostial diagonal stenosis, 70% proximal Circumflex stenosis, 80% mid Circumflex stenosis, 40% stenoses in both obtuse marginal branches and a 99% distal Circumflex stenosis in a small caliber portion of the vessel after the last OM branch. The LAD had minimal plaque disease. LVEF was 35% by LVgram but f/u echo showed LVEF 15%. He also had a h/o ETOH abuse and he was felt to be too high risk for coumadin due to this plus need for Effient with ACS/DES. Rate control was employed with metoprolol. Echo 02/27/11: EF 15%, mod LVE, mild to mod MR, mod BAE, mod RVE, mod decreased RVSF, PASP 48, mild to mod pulmo HTN. Biventricular failure felt to be out of proportion to CAD and suspected to have mixed ICM/NICM possibly related to ETOH. Lasix also started for systolic CHF. CHF meds were titrated. I saw him 03/26/11 for f/u and we discussed staging of other vessels. He wanted to wait until after Christmas. I brought him in January 16,2013 and placed drug eluting stents in the proximal and mid Circumflex and in the distal RCA. He stopped smoking cigarettes and drinking alcohol. I started him on coumadin at his visit on 05/29/11. He has been following in the coumadin clinic. DCCV in March 2013. He converted to NSR and maintained NSR but converted back to atrial fibrillation in July 2013 but did not want DCCV. Echo August 17, 2011 with Resolution of LV function with LVEF of  50-55%. Normal ABI in January 2013.   He is here today for follow up. No chest pain or SOB. No dizziness near syncope or syncope. He is not aware of any palpitations.   Primary Care Physician: None  Last Lipid Profile:Lipid Panel     Component Value Date/Time   CHOL 131 06/29/2011 1007   TRIG 112.0 06/29/2011 1007   HDL 36.60* 06/29/2011 1007   CHOLHDL 4 06/29/2011 1007   VLDL 22.4 06/29/2011 1007   LDLCALC 72 06/29/2011 1007     Past Medical History  Diagnosis Date  . CAD (coronary artery disease)     Inf STEMI 11/12: LHC at [facility] 02/27/11: oDx 50%, pCFX 70%, mCFX 80%, OM1 40%, oOM2 40%, dCFX 99% (small - 2mm), pRCA occluded, EF 35%.  He underwent PCI with Promus DES to the pRCA. s/p planned PCI w/ DES to prox Lcx, DES to mid LCx, DES to distal RCA 05/13/11.  . Ischemic cardiomyopathy     Echo 02/27/11: EF 15%, mod LVE, mild to mod MR, mod BAE, mod RVE, mod decreased RVSF, PASP 48, mild to mod pulmo HTN.  Marland Kitchen Chronic systolic heart failure   . Atrial fibrillation     On coumadin  . Alcohol abuse   . Tobacco abuse   . HLD (hyperlipidemia)   . Hemorrhoids   . HTN (hypertension)   . Chronic lower back pain     "sciatic nerve"    Past Surgical History  Procedure Laterality Date  .  Back surgery  ~ 1987    L4-5 ruptured disc  . Coronary angioplasty with stent placement  02/27/11    "1"  . Coronary angioplasty with stent placement  05/13/11    "3"  . Cardioversion  07/02/2011    Procedure: CARDIOVERSION;  Surgeon: Kathleene Hazel, MD;  Location: [facility] OR;  Service: Cardiovascular;  Laterality: N/A;    Current Outpatient Prescriptions  Medication Sig Dispense Refill  . aspirin EC 81 MG tablet Take 1 tablet (81 mg total) by mouth daily.  30 tablet  3  . atorvastatin (LIPITOR) 80 MG tablet Take 1 tablet (80 mg total) by mouth daily.  30 tablet  6  . lisinopril (PRINIVIL,ZESTRIL) 5 MG tablet Take one tablet by mouth twice daily  60 tablet  11  . metoprolol (LOPRESSOR) 100 MG tablet Take 1  tablet (100 mg total) by mouth 2 (two) times daily.  60 tablet  5  . nitroGLYCERIN (NITROSTAT) 0.4 MG SL tablet Place 1 tablet (0.4 mg total) under the tongue every 5 (five) minutes as needed. For chest pain  25 tablet  3  . spironolactone (ALDACTONE) 25 MG tablet Take 1 tablet (25 mg total) by mouth daily.  30 tablet  6  . warfarin (COUMADIN) 5 MG tablet Take as directed by anticoagulation clinic  30 tablet  3  . isosorbide mononitrate (IMDUR) 30 MG 24 hr tablet Take 1 tablet (30 mg total) by mouth daily.  30 tablet  11   No current facility-administered medications for this visit.    No Known Allergies  History   Social History  . Marital Status: Single    Spouse Name: N/A    Number of Children: N/A  . Years of Education: N/A   Occupational History  . Not on file.   Social History Main Topics  . Smoking status: Former Smoker -- 1.00 packs/day for 44 years    Types: Cigarettes    Quit date: 02/27/2011  . Smokeless tobacco: Former Neurosurgeon    Types: Chew    Quit date: 02/27/2011  . Alcohol Use: Yes     Comment: "stopped drinking alcohol 02/27/11; had been drinking 1 mixed drink qd 365 days/yr""  . Drug Use: Yes    Special: Marijuana     Comment: "used pot ages [age]"  . Sexually Active: Yes   Other Topics Concern  . Not on file   Social History Narrative  . No narrative on file    Family History  Problem Relation Age of Onset  . Hypertension Mother   . Heart attack Mother 53  . Stroke Father     5 STROKES AND MI IN HIS 80'S  . Heart attack Father 12    Review of Systems:  As stated in the HPI and otherwise negative.   BP 125/92  Pulse 74  Ht 6' (1.829 m)  Wt 198 lb 9.6 oz (90.084 kg)  BMI 26.93 kg/m2  Physical Examination: General: Well developed, well nourished, NAD HEENT: OP clear, mucus membranes moist SKIN: warm, dry. No rashes. Neuro: No focal deficits Musculoskeletal: Muscle strength 5/5 all ext Psychiatric: Mood and affect normal Neck: No JVD, no  carotid bruits, no thyromegaly, no lymphadenopathy. Lungs:Clear bilaterally, no wheezes, rhonci, crackles Cardiovascular: Regular rate and rhythm. No murmurs, gallops or rubs. Abdomen:Soft. Bowel sounds present. Non-tender.  Extremities: No lower extremity edema. Pulses are 2 + in the bilateral DP/PT.  EKG: Atrial fibrillation, rate 74 bpm.   Assessment and Plan:   1.  Atrial fibrillation: Rate controlled. Continue current therapy including coumadin. He does not wish to try DCCV again at that time.   2. CAD: Stable. No changes. Continue ASA, Beta blocker, Imdur, statin, Ace-inh. Will check lipids and LFTs today.   3. Ischemic cardiomyopathy: Resolved. On good therapy. No changes in therapy.

## 2012-11-03 ENCOUNTER — Ambulatory Visit (INDEPENDENT_AMBULATORY_CARE_PROVIDER_SITE_OTHER): Payer: Self-pay

## 2012-11-03 DIAGNOSIS — I4891 Unspecified atrial fibrillation: Secondary | ICD-10-CM

## 2012-11-03 DIAGNOSIS — Z7901 Long term (current) use of anticoagulants: Secondary | ICD-10-CM

## 2012-11-03 LAB — POCT INR: INR: 2.6

## 2012-11-13 ENCOUNTER — Emergency Department (HOSPITAL_COMMUNITY): Payer: Self-pay

## 2012-11-13 ENCOUNTER — Encounter (HOSPITAL_COMMUNITY): Payer: Self-pay

## 2012-11-13 ENCOUNTER — Emergency Department (HOSPITAL_COMMUNITY)
Admission: EM | Admit: 2012-11-13 | Discharge: 2012-11-13 | Disposition: A | Discharge status: Home / Self Care | Payer: Self-pay | Attending: Emergency Medicine | Admitting: Emergency Medicine

## 2012-11-13 DIAGNOSIS — M545 Low back pain, unspecified: Secondary | ICD-10-CM | POA: Insufficient documentation

## 2012-11-13 DIAGNOSIS — R6889 Other general symptoms and signs: Secondary | ICD-10-CM | POA: Insufficient documentation

## 2012-11-13 DIAGNOSIS — Z7982 Long term (current) use of aspirin: Secondary | ICD-10-CM | POA: Insufficient documentation

## 2012-11-13 DIAGNOSIS — I10 Essential (primary) hypertension: Secondary | ICD-10-CM | POA: Insufficient documentation

## 2012-11-13 DIAGNOSIS — Z7901 Long term (current) use of anticoagulants: Secondary | ICD-10-CM | POA: Insufficient documentation

## 2012-11-13 DIAGNOSIS — I2589 Other forms of chronic ischemic heart disease: Secondary | ICD-10-CM | POA: Insufficient documentation

## 2012-11-13 DIAGNOSIS — Z79899 Other long term (current) drug therapy: Secondary | ICD-10-CM | POA: Insufficient documentation

## 2012-11-13 DIAGNOSIS — I5022 Chronic systolic (congestive) heart failure: Secondary | ICD-10-CM | POA: Insufficient documentation

## 2012-11-13 DIAGNOSIS — F1010 Alcohol abuse, uncomplicated: Secondary | ICD-10-CM | POA: Insufficient documentation

## 2012-11-13 DIAGNOSIS — E785 Hyperlipidemia, unspecified: Secondary | ICD-10-CM | POA: Insufficient documentation

## 2012-11-13 DIAGNOSIS — G8929 Other chronic pain: Secondary | ICD-10-CM | POA: Insufficient documentation

## 2012-11-13 DIAGNOSIS — R079 Chest pain, unspecified: Secondary | ICD-10-CM | POA: Insufficient documentation

## 2012-11-13 DIAGNOSIS — I4891 Unspecified atrial fibrillation: Secondary | ICD-10-CM | POA: Insufficient documentation

## 2012-11-13 DIAGNOSIS — Z8719 Personal history of other diseases of the digestive system: Secondary | ICD-10-CM | POA: Insufficient documentation

## 2012-11-13 DIAGNOSIS — Z87891 Personal history of nicotine dependence: Secondary | ICD-10-CM | POA: Insufficient documentation

## 2012-11-13 MED ORDER — KETOROLAC TROMETHAMINE 60 MG/2ML IM SOLN
60.0000 mg | Freq: Once | INTRAMUSCULAR | Status: AC
  Administered 2012-11-13: 60 mg via INTRAMUSCULAR
  Filled 2012-11-13: qty 2

## 2012-11-13 MED ORDER — HYDROMORPHONE HCL PF 1 MG/ML IJ SOLN
1.0000 mg | Freq: Once | INTRAMUSCULAR | Status: AC
  Administered 2012-11-13: 1 mg via INTRAMUSCULAR
  Filled 2012-11-13: qty 1

## 2012-11-13 MED ORDER — OXYCODONE-ACETAMINOPHEN 5-325 MG PO TABS: 2.0000 | ORAL_TABLET | ORAL | Status: DC | PRN

## 2012-11-13 MED ORDER — NAPROXEN 500 MG PO TABS: 500.0000 mg | ORAL_TABLET | Freq: Two times a day (BID) | ORAL | Status: DC

## 2012-11-13 NOTE — ED Provider Notes (Signed)
History    CSN: [id number] Arrival date & time 11/13/12  4098  First MD Initiated Contact with Patient 11/13/12 1008     Chief Complaint  Patient presents with  . Chest Pain   (Consider location/radiation/quality/duration/timing/severity/associated sxs/prior Treatment) HPI Comments: Pt is a 62 y/o male with hx of coronary disease in the past, on Coumadin for atrial fibrillation, congestive heart failure. He presents after having sneezing fit earlier in the week. He sneezed more than 200 times in one day, the sneezing went away however he developed acute onset of left-sided chest pain during a sneezing fit yesterday. He can pinpoint as to the left anterior axillary line over the lower ribs. He is severely tender to the touch, he does not have any shortness of breath but feels he cannot take a deep breath because of severe pain. He has no swelling in his legs, no fevers chills, this pain as a sharp and shooting pain. This is not similar to prior heart attacks.  The history is provided by the patient and the spouse.   Past Medical History  Diagnosis Date  . CAD (coronary artery disease)     Inf STEMI 11/12: LHC at [facility] 02/27/11: oDx 50%, pCFX 70%, mCFX 80%, OM1 40%, oOM2 40%, dCFX 99% (small - 2mm), pRCA occluded, EF 35%.  He underwent PCI with Promus DES to the pRCA. s/p planned PCI w/ DES to prox Lcx, DES to mid LCx, DES to distal RCA 05/13/11.  . Ischemic cardiomyopathy     Echo 02/27/11: EF 15%, mod LVE, mild to mod MR, mod BAE, mod RVE, mod decreased RVSF, PASP 48, mild to mod pulmo HTN.  Marland Kitchen Chronic systolic heart failure   . Atrial fibrillation     On coumadin  . Alcohol abuse   . Tobacco abuse   . HLD (hyperlipidemia)   . Hemorrhoids   . HTN (hypertension)   . Chronic lower back pain     "sciatic nerve"   Past Surgical History  Procedure Laterality Date  . Back surgery  ~ 1987    L4-5 ruptured disc  . Coronary angioplasty with stent placement  02/27/11    "1"  . Coronary  angioplasty with stent placement  05/13/11    "3"  . Cardioversion  07/02/2011    Procedure: CARDIOVERSION;  Surgeon: Kathleene Hazel, MD;  Location: [facility] OR;  Service: Cardiovascular;  Laterality: N/A;   Family History  Problem Relation Age of Onset  . Hypertension Mother   . Heart attack Mother 88  . Stroke Father     5 STROKES AND MI IN HIS 80'S  . Heart attack Father 82   History  Substance Use Topics  . Smoking status: Former Smoker -- 1.00 packs/day for 44 years    Types: Cigarettes    Quit date: 02/27/2011  . Smokeless tobacco: Former Neurosurgeon    Types: Chew    Quit date: 02/27/2011  . Alcohol Use: Yes     Comment: "stopped drinking alcohol 02/27/11; had been drinking 1 mixed drink qd 365 days/yr""    Review of Systems  All other systems reviewed and are negative.    Allergies  Review of patient's allergies indicates no known allergies.  Home Medications   Current Outpatient Rx  Name  Route  Sig  Dispense  Refill  . acetaminophen (TYLENOL) 500 MG tablet   Oral   Take 500 mg by mouth every 6 (six) hours as needed for pain.         Marland Kitchen  aspirin EC 81 MG tablet   Oral   Take 1 tablet (81 mg total) by mouth daily.   30 tablet   3   . atorvastatin (LIPITOR) 80 MG tablet   Oral   Take 1 tablet (80 mg total) by mouth daily.   30 tablet   6   . lisinopril (PRINIVIL,ZESTRIL) 5 MG tablet   Oral   Take 5 mg by mouth 2 (two) times daily.         . metoprolol (LOPRESSOR) 100 MG tablet   Oral   Take 1 tablet (100 mg total) by mouth 2 (two) times daily.   60 tablet   5   . sildenafil (VIAGRA) 50 MG tablet   Oral   Take 1 tablet (50 mg total) by mouth daily as needed for erectile dysfunction.   10 tablet   3   . spironolactone (ALDACTONE) 25 MG tablet   Oral   Take 1 tablet (25 mg total) by mouth daily.   30 tablet   6   . warfarin (COUMADIN) 2.5 MG tablet   Oral   Take 2.5 mg by mouth once a week. On Wednesday.         . warfarin (COUMADIN) 5 MG  tablet   Oral   Take 5 mg by mouth See admin instructions. One (1) tablet everyday except Wednesday         . naproxen (NAPROSYN) 500 MG tablet   Oral   Take 1 tablet (500 mg total) by mouth 2 (two) times daily with a meal.   30 tablet   0   . nitroGLYCERIN (NITROSTAT) 0.4 MG SL tablet   Sublingual   Place 1 tablet (0.4 mg total) under the tongue every 5 (five) minutes as needed. For chest pain   25 tablet   3   . oxyCODONE-acetaminophen (PERCOCET/ROXICET) 5-325 MG per tablet   Oral   Take 2 tablets by mouth every 4 (four) hours as needed for pain.   15 tablet   0    BP 119/88  Pulse 95  Temp(Src) 98.7 F (37.1 C) (Oral)  Resp 17  Ht 6' (1.829 m)  Wt 194 lb (87.998 kg)  BMI 26.31 kg/m2  SpO2 100% Physical Exam  Nursing note and vitals reviewed. Constitutional: He appears well-developed and well-nourished. No distress.  HENT:  Head: Normocephalic and atraumatic.  Mouth/Throat: Oropharynx is clear and moist. No oropharyngeal exudate.  Eyes: Conjunctivae and EOM are normal. Pupils are equal, round, and reactive to light. Right eye exhibits no discharge. Left eye exhibits no discharge. No scleral icterus.  Neck: Normal range of motion. Neck supple. No JVD present. No thyromegaly present.  Cardiovascular: Normal rate, regular rhythm, normal heart sounds and intact distal pulses.  Exam reveals no gallop and no friction rub.   No murmur heard. Pulmonary/Chest: Effort normal and breath sounds normal. No respiratory distress. He has no wheezes. He has no rales. He exhibits tenderness ( Severe tenderness to very light palpation over the chest on the left anterior axillary line and mid axillary line on the left lower ribs).  Abdominal: Soft. Bowel sounds are normal. He exhibits no distension and no mass. There is no tenderness.  Musculoskeletal: Normal range of motion. He exhibits no edema and no tenderness.  Lymphadenopathy:    He has no cervical adenopathy.  Neurological: He  is alert. Coordination normal.  Skin: Skin is warm and dry. No rash noted. No erythema.  Psychiatric: He has a normal  mood and affect. His behavior is normal.    ED Course  Procedures (including critical care time) Labs Reviewed - No data to display Dg Ribs Unilateral W/chest Left  11/13/2012   *RADIOLOGY REPORT*  Clinical Data: Left rib pain  LEFT RIBS AND CHEST - 3+ VIEW  Comparison: None.  Findings: Lungs are essentially clear.  No pleural effusion or pneumothorax.  Heart is top normal in size.  No displaced left rib fracture is seen.  IMPRESSION: No displaced left rib fracture is seen.   Original Report Authenticated By: Charline Bills, M.D.   1. Chest pain     MDM  The patient has no abdominal tenderness whatsoever, no peripheral edema, lung sounds are normal but he is unable to take a deep breath secondary to pain. He will need a rib x-ray to rule out pneumothorax and rib fractures however I suspect muscle strain. He is on Coumadin but there is no signs of swelling or bleeding. Imaging pending, pain medications ordered. The patient does appear hemodynamically stable.    Pt has normal w/u with his CP which is chest wall pain even to very light touch.  He has no frx on his xrays which I have personally seen and interpretted.  VS show that he has mild tachycardia in his afib - I have recommended that he take an extra dose of his BB at home.  Pt expressed understanding, Incentive Spir given.  Improved with meds prior to d/c.  Meds given in ED:  Medications  HYDROmorphone (DILAUDID) injection 1 mg (1 mg Intramuscular Given 11/13/12 1111)  ketorolac (TORADOL) injection 60 mg (60 mg Intramuscular Given 11/13/12 1108)    Discharge Medication List as of 11/13/2012 11:34 AM    START taking these medications   Details  naproxen (NAPROSYN) 500 MG tablet Take 1 tablet (500 mg total) by mouth 2 (two) times daily with a meal., Starting 11/13/2012, Until Discontinued, Print     oxyCODONE-acetaminophen (PERCOCET/ROXICET) 5-325 MG per tablet Take 2 tablets by mouth every 4 (four) hours as needed for pain., Starting 11/13/2012, Until Discontinued, Print          Vida Roller, MD 11/13/12 1249

## 2012-11-13 NOTE — ED Notes (Signed)
Signature pad not working. 

## 2012-11-13 NOTE — ED Notes (Signed)
Pt presents to ED this morning. Pt states he sneezed 3 times and felt and heard something "Pop." Pt states within an hour he was in pain. Pt states pain worsening throughout the night into this morning.

## 2012-12-01 ENCOUNTER — Ambulatory Visit (INDEPENDENT_AMBULATORY_CARE_PROVIDER_SITE_OTHER): Payer: Self-pay

## 2012-12-01 DIAGNOSIS — I4891 Unspecified atrial fibrillation: Secondary | ICD-10-CM

## 2012-12-01 DIAGNOSIS — Z7901 Long term (current) use of anticoagulants: Secondary | ICD-10-CM

## 2012-12-01 LAB — POCT INR: INR: 3.4

## 2012-12-29 ENCOUNTER — Ambulatory Visit (INDEPENDENT_AMBULATORY_CARE_PROVIDER_SITE_OTHER): Payer: Self-pay | Admitting: Pharmacist

## 2012-12-29 DIAGNOSIS — Z7901 Long term (current) use of anticoagulants: Secondary | ICD-10-CM

## 2012-12-29 DIAGNOSIS — I4891 Unspecified atrial fibrillation: Secondary | ICD-10-CM

## 2012-12-29 LAB — POCT INR: INR: 3.2

## 2013-01-03 ENCOUNTER — Other Ambulatory Visit: Payer: Self-pay

## 2013-01-03 MED ORDER — SPIRONOLACTONE 25 MG PO TABS: 25.0000 mg | ORAL_TABLET | Freq: Every day | ORAL | Status: DC

## 2013-01-24 ENCOUNTER — Other Ambulatory Visit: Payer: Self-pay

## 2013-01-24 MED ORDER — METOPROLOL TARTRATE 100 MG PO TABS: 100.0000 mg | ORAL_TABLET | Freq: Two times a day (BID) | ORAL | Status: DC

## 2013-01-26 ENCOUNTER — Ambulatory Visit (INDEPENDENT_AMBULATORY_CARE_PROVIDER_SITE_OTHER): Payer: Self-pay | Admitting: General Practice

## 2013-01-26 DIAGNOSIS — I4891 Unspecified atrial fibrillation: Secondary | ICD-10-CM

## 2013-01-26 DIAGNOSIS — Z7901 Long term (current) use of anticoagulants: Secondary | ICD-10-CM

## 2013-02-10 ENCOUNTER — Encounter: Payer: Self-pay | Admitting: Cardiovascular Disease

## 2013-02-16 ENCOUNTER — Other Ambulatory Visit: Payer: Self-pay | Admitting: *Deleted

## 2013-02-16 MED ORDER — WARFARIN SODIUM 5 MG PO TABS: 5.0000 mg | ORAL_TABLET | ORAL | Status: DC

## 2013-02-23 ENCOUNTER — Ambulatory Visit (INDEPENDENT_AMBULATORY_CARE_PROVIDER_SITE_OTHER): Payer: Self-pay | Admitting: *Deleted

## 2013-02-23 DIAGNOSIS — I4891 Unspecified atrial fibrillation: Secondary | ICD-10-CM

## 2013-02-23 DIAGNOSIS — Z7901 Long term (current) use of anticoagulants: Secondary | ICD-10-CM

## 2013-02-23 LAB — POCT INR: INR: 1.9

## 2013-03-20 ENCOUNTER — Other Ambulatory Visit: Payer: Self-pay

## 2013-03-20 DIAGNOSIS — E785 Hyperlipidemia, unspecified: Secondary | ICD-10-CM

## 2013-03-20 MED ORDER — ATORVASTATIN CALCIUM 80 MG PO TABS: 80.0000 mg | ORAL_TABLET | Freq: Every day | ORAL | Status: DC

## 2013-03-22 ENCOUNTER — Ambulatory Visit (INDEPENDENT_AMBULATORY_CARE_PROVIDER_SITE_OTHER): Payer: Self-pay | Admitting: *Deleted

## 2013-03-22 DIAGNOSIS — I4891 Unspecified atrial fibrillation: Secondary | ICD-10-CM

## 2013-03-22 DIAGNOSIS — Z7901 Long term (current) use of anticoagulants: Secondary | ICD-10-CM

## 2013-04-17 ENCOUNTER — Ambulatory Visit (INDEPENDENT_AMBULATORY_CARE_PROVIDER_SITE_OTHER): Payer: Self-pay | Admitting: Pharmacist

## 2013-04-17 DIAGNOSIS — Z7901 Long term (current) use of anticoagulants: Secondary | ICD-10-CM

## 2013-04-17 DIAGNOSIS — I4891 Unspecified atrial fibrillation: Secondary | ICD-10-CM

## 2013-04-19 ENCOUNTER — Ambulatory Visit: Payer: Self-pay | Admitting: Cardiovascular Disease

## 2013-05-01 ENCOUNTER — Ambulatory Visit (INDEPENDENT_AMBULATORY_CARE_PROVIDER_SITE_OTHER): Payer: Self-pay | Admitting: Cardiovascular Disease

## 2013-05-01 ENCOUNTER — Encounter: Payer: Self-pay | Admitting: Cardiovascular Disease

## 2013-05-01 VITALS — BP 114/90 | HR 60 | Ht 72.0 in | Wt 199.1 lb

## 2013-05-01 DIAGNOSIS — I2589 Other forms of chronic ischemic heart disease: Secondary | ICD-10-CM

## 2013-05-01 DIAGNOSIS — I4891 Unspecified atrial fibrillation: Secondary | ICD-10-CM

## 2013-05-01 DIAGNOSIS — I255 Ischemic cardiomyopathy: Secondary | ICD-10-CM

## 2013-05-01 DIAGNOSIS — Z87891 Personal history of nicotine dependence: Secondary | ICD-10-CM

## 2013-05-01 DIAGNOSIS — I2510 Atherosclerotic heart disease of native coronary artery without angina pectoris: Secondary | ICD-10-CM

## 2013-05-01 DIAGNOSIS — F17201 Nicotine dependence, unspecified, in remission: Secondary | ICD-10-CM

## 2013-05-01 NOTE — Patient Instructions (Signed)
Your physician wants you to follow-up in:  6 months. You will receive a reminder letter in the mail two months in advance. If you don't receive a letter, please call our office to schedule the follow-up appointment.   

## 2013-05-01 NOTE — Progress Notes (Signed)
History of Present Illness: 63 y.o. WM with history of CAD diagnosed November 2012 at time of inferior STEMI, atrial fibrillation diagnosed November 2012, cardiomyopathy felt to be secondary to etoh and possibly ischemia, tobacco abuse, etoh abuse here today for cardiac follow up. He was admitted to [facility] November 2-8, 2012 with an an inferior STEMI. He was also noted to be in atrial fibrillation at the time of admission. Emergent LHC at [facility] 02/27/11 with occluded RCA treated with a Promus DES in the proximal RCA. He was also found to have 50% ostial diagonal stenosis, 70% proximal Circumflex stenosis, 80% mid Circumflex stenosis, 40% stenoses in both obtuse marginal branches and a 99% distal Circumflex stenosis in a small caliber portion of the vessel after the last OM branch. The LAD had minimal plaque disease. LVEF was 35% by LVgram but f/u echo showed LVEF 15%. He also had a h/o ETOH abuse and he was felt to be too high risk for coumadin due to this plus need for Effient with ACS/DES. Rate control was employed with metoprolol. Echo 02/27/11: EF 15%, mod LVE, mild to mod MR, mod BAE, mod RVE, mod decreased RVSF, PASP 48, mild to mod pulmo HTN. Biventricular failure felt to be out of proportion to CAD and suspected to have mixed ICM/NICM possibly related to ETOH. I brought him in January 16,2013 and placed drug eluting stents in the proximal and mid Circumflex and in the distal RCA. He stopped smoking cigarettes and drinking alcohol. I started him on coumadin at his visit on 05/29/11. He has been following in the coumadin clinic. DCCV in March 2013. He converted to NSR and maintained NSR but converted back to atrial fibrillation in July 2013 but did not want DCCV. Echo August 17, 2011 with Resolution of LV function with LVEF of 50-55%. Normal ABI in January 2013.   He is here today for follow up. No chest pain or SOB. No dizziness near syncope or syncope. He is not aware of any palpitations.    Primary Care Physician: None  Last Lipid Profile:Lipid Panel     Component Value Date/Time   CHOL 126 10/18/2012 0922   TRIG 99.0 10/18/2012 0922   HDL 33.30* 10/18/2012 0922   CHOLHDL 4 10/18/2012 0922   VLDL 19.8 10/18/2012 0922   LDLCALC 73 10/18/2012 8119     Past Medical History  Diagnosis Date  . CAD (coronary artery disease)     Inf STEMI 11/12: LHC at [facility] 02/27/11: oDx 50%, pCFX 70%, mCFX 80%, OM1 40%, oOM2 40%, dCFX 99% (small - 2mm), pRCA occluded, EF 35%.  He underwent PCI with Promus DES to the pRCA. s/p planned PCI w/ DES to prox Lcx, DES to mid LCx, DES to distal RCA 05/13/11.  . Ischemic cardiomyopathy     Echo 02/27/11: EF 15%, mod LVE, mild to mod MR, mod BAE, mod RVE, mod decreased RVSF, PASP 48, mild to mod pulmo HTN.  Marland Kitchen Chronic systolic heart failure   . Atrial fibrillation     On coumadin  . Alcohol abuse   . Tobacco abuse   . HLD (hyperlipidemia)   . Hemorrhoids   . HTN (hypertension)   . Chronic lower back pain     "sciatic nerve"    Past Surgical History  Procedure Laterality Date  . Back surgery  ~ 1987    L4-5 ruptured disc  . Coronary angioplasty with stent placement  02/27/11    "1"  . Coronary angioplasty  with stent placement  05/13/11    "3"  . Cardioversion  07/02/2011    Procedure: CARDIOVERSION;  Surgeon: Kathleene Hazelhristopher D McAlhany, MD;  Location: [facility] OR;  Service: Cardiovascular;  Laterality: N/A;    Current Outpatient Prescriptions  Medication Sig Dispense Refill  . acetaminophen (TYLENOL) 500 MG tablet Take 500 mg by mouth every 6 (six) hours as needed for pain.      Marland Kitchen. aspirin EC 81 MG tablet Take 1 tablet (81 mg total) by mouth daily.  30 tablet  3  . atorvastatin (LIPITOR) 80 MG tablet Take 1 tablet (80 mg total) by mouth daily.  30 tablet  6  . lisinopril (PRINIVIL,ZESTRIL) 5 MG tablet Take 5 mg by mouth 2 (two) times daily.      . metoprolol (LOPRESSOR) 100 MG tablet Take 1 tablet (100 mg total) by mouth 2 (two) times daily.  60 tablet  5   . naproxen (NAPROSYN) 500 MG tablet Take 1 tablet (500 mg total) by mouth 2 (two) times daily with a meal.  30 tablet  0  . nitroGLYCERIN (NITROSTAT) 0.4 MG SL tablet Place 1 tablet (0.4 mg total) under the tongue every 5 (five) minutes as needed. For chest pain  25 tablet  3  . oxyCODONE-acetaminophen (PERCOCET/ROXICET) 5-325 MG per tablet Take 2 tablets by mouth every 4 (four) hours as needed for pain.  15 tablet  0  . sildenafil (VIAGRA) 50 MG tablet Take 1 tablet (50 mg total) by mouth daily as needed for erectile dysfunction.  10 tablet  3  . spironolactone (ALDACTONE) 25 MG tablet Take 1 tablet (25 mg total) by mouth daily.  30 tablet  6  . warfarin (COUMADIN) 2.5 MG tablet Take 2.5 mg by mouth once a week. On Wednesday.      . warfarin (COUMADIN) 5 MG tablet Take 1 tablet (5 mg total) by mouth as directed.  30 tablet  3   No current facility-administered medications for this visit.    No Known Allergies  History   Social History  . Marital Status: Single    Spouse Name: N/A    Number of Children: N/A  . Years of Education: N/A   Occupational History  . Not on file.   Social History Main Topics  . Smoking status: Former Smoker -- 1.00 packs/day for 44 years    Types: Cigarettes    Quit date: 02/27/2011  . Smokeless tobacco: Former NeurosurgeonUser    Types: Chew    Quit date: 02/27/2011  . Alcohol Use: Yes     Comment: "stopped drinking alcohol 02/27/11; had been drinking 1 mixed drink qd 365 days/yr""  . Drug Use: Yes    Special: Marijuana     Comment: "used pot ages [age]"  . Sexual Activity: Yes   Other Topics Concern  . Not on file   Social History Narrative  . No narrative on file    Family History  Problem Relation Age of Onset  . Hypertension Mother   . Heart attack Mother [id number]  . Stroke Father     5 STROKES AND MI IN HIS 80'S  . Heart attack Father 1380    Review of Systems:  As stated in the HPI and otherwise negative.   BP 114/90  Pulse 60  Ht 6' (1.829 m)   Wt 199 lb 1.9 oz (90.32 kg)  BMI 27.00 kg/m2  Physical Examination: General: Well developed, well nourished, NAD HEENT: OP clear, mucus membranes moist SKIN: warm, dry.  No rashes. Neuro: No focal deficits Musculoskeletal: Muscle strength 5/5 all ext Psychiatric: Mood and affect normal Neck: No JVD, no carotid bruits, no thyromegaly, no lymphadenopathy. Lungs:Clear bilaterally, no wheezes, rhonci, crackles Cardiovascular: Regular rate and rhythm. No murmurs, gallops or rubs. Abdomen:Soft. Bowel sounds present. Non-tender.  Extremities: No lower extremity edema. Pulses are 2 + in the bilateral DP/PT.  Assessment and Plan:   1. Atrial fibrillation: Rate controlled. Continue current therapy including coumadin. He does not wish to try DCCV again at that time.   2. CAD: Stable. No changes. Continue ASA, Beta blocker, Imdur, statin, Ace-inh.   3. Ischemic cardiomyopathy: LVEF is now normal. On good therapy. No changes in therapy.   4. Tobacco abuse, in remission: He has stopped smoking.

## 2013-05-15 ENCOUNTER — Ambulatory Visit (INDEPENDENT_AMBULATORY_CARE_PROVIDER_SITE_OTHER): Payer: Self-pay | Admitting: Pharmacist

## 2013-05-15 DIAGNOSIS — I4891 Unspecified atrial fibrillation: Secondary | ICD-10-CM

## 2013-05-15 DIAGNOSIS — Z7901 Long term (current) use of anticoagulants: Secondary | ICD-10-CM

## 2013-05-15 LAB — POCT INR: INR: 3

## 2013-06-16 ENCOUNTER — Other Ambulatory Visit: Payer: Self-pay | Admitting: *Deleted

## 2013-06-16 MED ORDER — WARFARIN SODIUM 5 MG PO TABS: 5.0000 mg | ORAL_TABLET | ORAL | Status: DC

## 2013-06-26 ENCOUNTER — Ambulatory Visit (INDEPENDENT_AMBULATORY_CARE_PROVIDER_SITE_OTHER): Payer: Self-pay | Admitting: Pharmacist

## 2013-06-26 DIAGNOSIS — Z7901 Long term (current) use of anticoagulants: Secondary | ICD-10-CM

## 2013-06-26 DIAGNOSIS — I4891 Unspecified atrial fibrillation: Secondary | ICD-10-CM

## 2013-06-26 LAB — POCT INR: INR: 1.8

## 2013-07-27 ENCOUNTER — Other Ambulatory Visit: Payer: Self-pay | Admitting: *Deleted

## 2013-07-27 MED ORDER — METOPROLOL TARTRATE 100 MG PO TABS: 100.0000 mg | ORAL_TABLET | Freq: Two times a day (BID) | ORAL | Status: DC

## 2013-08-01 ENCOUNTER — Ambulatory Visit (INDEPENDENT_AMBULATORY_CARE_PROVIDER_SITE_OTHER): Payer: Medicare Other | Admitting: Pharmacist

## 2013-08-01 DIAGNOSIS — Z7901 Long term (current) use of anticoagulants: Secondary | ICD-10-CM

## 2013-08-01 DIAGNOSIS — I4891 Unspecified atrial fibrillation: Secondary | ICD-10-CM

## 2013-08-01 LAB — POCT INR: INR: 1.8

## 2013-08-16 ENCOUNTER — Other Ambulatory Visit: Payer: Self-pay | Admitting: *Deleted

## 2013-08-16 MED ORDER — LISINOPRIL 5 MG PO TABS: 5.0000 mg | ORAL_TABLET | Freq: Two times a day (BID) | ORAL | Status: DC

## 2013-08-22 ENCOUNTER — Ambulatory Visit (INDEPENDENT_AMBULATORY_CARE_PROVIDER_SITE_OTHER): Payer: Medicare Other | Admitting: Pharmacist Clinician (PhC)/ Clinical Pharmacy Specialist

## 2013-08-22 DIAGNOSIS — I4891 Unspecified atrial fibrillation: Secondary | ICD-10-CM

## 2013-08-22 DIAGNOSIS — Z7901 Long term (current) use of anticoagulants: Secondary | ICD-10-CM

## 2013-08-22 LAB — POCT INR: INR: 3.1

## 2013-08-24 ENCOUNTER — Other Ambulatory Visit: Payer: Self-pay

## 2013-08-24 MED ORDER — SPIRONOLACTONE 25 MG PO TABS: 25.0000 mg | ORAL_TABLET | Freq: Every day | ORAL | Status: DC

## 2013-09-19 ENCOUNTER — Ambulatory Visit (INDEPENDENT_AMBULATORY_CARE_PROVIDER_SITE_OTHER): Payer: Medicare Other | Admitting: Pharmacist Clinician (PhC)/ Clinical Pharmacy Specialist

## 2013-09-19 DIAGNOSIS — Z7901 Long term (current) use of anticoagulants: Secondary | ICD-10-CM | POA: Diagnosis not present

## 2013-09-19 DIAGNOSIS — I4891 Unspecified atrial fibrillation: Secondary | ICD-10-CM | POA: Diagnosis not present

## 2013-09-19 LAB — POCT INR: INR: 2.7

## 2013-09-20 ENCOUNTER — Other Ambulatory Visit: Payer: Self-pay

## 2013-09-20 DIAGNOSIS — E785 Hyperlipidemia, unspecified: Secondary | ICD-10-CM

## 2013-09-20 MED ORDER — ATORVASTATIN CALCIUM 80 MG PO TABS: 80.0000 mg | ORAL_TABLET | Freq: Every day | ORAL | Status: DC

## 2013-10-23 ENCOUNTER — Other Ambulatory Visit: Payer: Self-pay | Admitting: *Deleted

## 2013-10-23 ENCOUNTER — Other Ambulatory Visit: Payer: Self-pay

## 2013-10-23 MED ORDER — NITROGLYCERIN 0.4 MG SL SUBL: 0.4000 mg | SUBLINGUAL_TABLET | SUBLINGUAL | Status: DC | PRN

## 2013-10-23 MED ORDER — WARFARIN SODIUM 5 MG PO TABS: ORAL_TABLET | ORAL | Status: DC

## 2013-10-24 ENCOUNTER — Other Ambulatory Visit: Payer: Self-pay | Admitting: *Deleted

## 2013-10-24 MED ORDER — METOPROLOL TARTRATE 100 MG PO TABS: 100.0000 mg | ORAL_TABLET | Freq: Two times a day (BID) | ORAL | Status: DC

## 2013-11-01 ENCOUNTER — Ambulatory Visit (INDEPENDENT_AMBULATORY_CARE_PROVIDER_SITE_OTHER): Payer: Medicare Other | Admitting: *Deleted

## 2013-11-01 ENCOUNTER — Encounter: Payer: Self-pay | Admitting: Cardiovascular Disease

## 2013-11-01 ENCOUNTER — Ambulatory Visit (INDEPENDENT_AMBULATORY_CARE_PROVIDER_SITE_OTHER): Payer: Medicare Other | Admitting: Cardiovascular Disease

## 2013-11-01 VITALS — BP 140/98 | HR 76 | Ht 72.0 in | Wt 199.0 lb

## 2013-11-01 DIAGNOSIS — Z87891 Personal history of nicotine dependence: Secondary | ICD-10-CM

## 2013-11-01 DIAGNOSIS — F17201 Nicotine dependence, unspecified, in remission: Secondary | ICD-10-CM

## 2013-11-01 DIAGNOSIS — I2589 Other forms of chronic ischemic heart disease: Secondary | ICD-10-CM | POA: Diagnosis not present

## 2013-11-01 DIAGNOSIS — I4891 Unspecified atrial fibrillation: Secondary | ICD-10-CM | POA: Diagnosis not present

## 2013-11-01 DIAGNOSIS — I255 Ischemic cardiomyopathy: Secondary | ICD-10-CM

## 2013-11-01 DIAGNOSIS — I10 Essential (primary) hypertension: Secondary | ICD-10-CM

## 2013-11-01 DIAGNOSIS — I2510 Atherosclerotic heart disease of native coronary artery without angina pectoris: Secondary | ICD-10-CM

## 2013-11-01 DIAGNOSIS — Z7901 Long term (current) use of anticoagulants: Secondary | ICD-10-CM

## 2013-11-01 DIAGNOSIS — I482 Chronic atrial fibrillation, unspecified: Secondary | ICD-10-CM

## 2013-11-01 LAB — LIPID PANEL
CHOL/HDL RATIO: 4
Cholesterol: 137 mg/dL (ref 0–200)
HDL: 36.4 mg/dL — ABNORMAL LOW (ref >39.00)
LDL CALC: 71 mg/dL (ref 0–99)
NonHDL: 100.6
TRIGLYCERIDES: 148 mg/dL (ref 0.0–149.0)
VLDL: 29.6 mg/dL (ref 0.0–40.0)

## 2013-11-01 LAB — HEPATIC FUNCTION PANEL
ALBUMIN: 4.3 g/dL (ref 3.5–5.2)
ALT: 34 U/L (ref 0–53)
AST: 34 U/L (ref 0–37)
Alkaline Phosphatase: 65 U/L (ref 39–117)
BILIRUBIN DIRECT: 0 mg/dL (ref 0.0–0.3)
TOTAL PROTEIN: 7.3 g/dL (ref 6.0–8.3)
Total Bilirubin: 0.9 mg/dL (ref 0.2–1.2)

## 2013-11-01 LAB — POCT INR: INR: 3

## 2013-11-01 MED ORDER — LISINOPRIL 5 MG PO TABS: 5.0000 mg | ORAL_TABLET | Freq: Every day | ORAL | Status: DC

## 2013-11-01 NOTE — Patient Instructions (Signed)
Your physician wants you to follow-up in: 6 months.   You will receive a reminder letter in the mail two months in advance. If you don't receive a letter, please call our office to schedule the follow-up appointment.  Your physician has recommended you make the following change in your medication:  Decrease Lisinopril to 5 mg by mouth daily.

## 2013-11-01 NOTE — Progress Notes (Signed)
History of Present Illness: 63 y.o. WM with history of CAD diagnosed November 2012 at time of inferior STEMI, persistent atrial fibrillation diagnosed November 2012, cardiomyopathy felt to be secondary to etoh and ischemia, tobacco abuse now in remission and etoh abuse in remission here today for cardiac follow up. [facility] was [facility] November 2-8, 2012 with an an inferior STEMI. He was also noted to be in atrial fibrillation at the time of admission. [facility] 02/27/11 with occluded RCA treated with a Promus DES in the proximal RCA. He was also found to have 50% ostial diagonal stenosis, 70% proximal Circumflex stenosis, 80% mid Circumflex stenosis, 40% stenoses in both obtuse marginal branches and a 99% distal Circumflex stenosis in a small caliber portion of the vessel after the last OM branch. The LAD had minimal plaque disease. LVEF was 35% by LVgram but f/u echo showed LVEF 15%. Echo 02/27/11: EF 15%, mod LVE, mild to mod MR, mod BAE, mod RVE, mod decreased RVSF, PASP 48, mild to mod pulmo HTN. Biventricular failure felt to be out of proportion to CAD and suspected to have mixed ICM/NICM possibly related to ETOH. I brought him in January 16,2013 and placed drug eluting stents in the proximal and mid Circumflex and in the distal RCA. He stopped smoking cigarettes and drinking alcohol. I started him on coumadin at his visit on 05/29/11. He has been following in the coumadin clinic. DCCV in March 2013. He converted to NSR and maintained NSR but converted back to atrial fibrillation in July 2013 but did not want repeat DCCV. Echo August 17, 2011 with resolution of LV function with LVEF of 50-55%. Normal ABI in January 2013.   He is here today for follow up. No chest pain or SOB. No dizziness near syncope or syncope. He is not aware of any palpitations. He has been very active. He has been splitting wood by hand.   Primary Care Physician: None  Last Lipid Profile:Lipid Panel    Component Value Date/Time   CHOL 126 10/18/2012 0922   TRIG 99.0 10/18/2012 0922   HDL 33.30* 10/18/2012 0922   CHOLHDL 4 10/18/2012 0922   VLDL 19.8 10/18/2012 0922   LDLCALC 73 10/18/2012 [id number]    Past Medical History  Diagnosis Date  . CAD (coronary artery disease)     Inf STEMI 11/12: [facility] 02/27/11: oDx 50%, pCFX 70%, mCFX 80%, OM1 40%, oOM2 40%, dCFX 99% (small - 2mm), pRCA occluded, EF 35%.  He underwent PCI with Promus DES to the pRCA. s/p planned PCI w/ DES to prox Lcx, DES to mid LCx, DES to distal RCA 05/13/11.  . Ischemic cardiomyopathy     Echo 02/27/11: EF 15%, mod LVE, mild to mod MR, mod BAE, mod RVE, mod decreased RVSF, PASP 48, mild to mod pulmo HTN.  Marland Kitchen. Chronic systolic heart failure   . Atrial fibrillation     On coumadin  . Alcohol abuse   . Tobacco abuse   . HLD (hyperlipidemia)   . Hemorrhoids   . HTN (hypertension)   . Chronic lower back pain     "sciatic nerve"    Past Surgical History  Procedure Laterality Date  . Back surgery  ~ 1987    L4-5 ruptured disc  . Coronary angioplasty with stent placement  02/27/11    "1"  . Coronary angioplasty with stent placement  05/13/11    "3"  . Cardioversion  07/02/2011  Procedure: CARDIOVERSION;  Surgeon: Kathleene Hazel, MD;  Location: [facility] OR;  Service: Cardiovascular;  Laterality: N/A;    Current Outpatient Prescriptions  Medication Sig Dispense Refill  . acetaminophen (TYLENOL) 500 MG tablet Take 500 mg by mouth every 6 (six) hours as needed for pain.      Marland Kitchen aspirin EC 81 MG tablet Take 1 tablet (81 mg total) by mouth daily.  30 tablet  3  . atorvastatin (LIPITOR) 80 MG tablet Take 1 tablet (80 mg total) by mouth daily.  30 tablet  6  . lisinopril (PRINIVIL,ZESTRIL) 5 MG tablet Take 1 tablet (5 mg total) by mouth 2 (two) times daily.  60 tablet  2  . metoprolol (LOPRESSOR) 100 MG tablet Take 1 tablet (100 mg total) by mouth 2 (two) times daily.  60 tablet  0  . nitroGLYCERIN (NITROSTAT) 0.4 MG SL tablet  Place 1 tablet (0.4 mg total) under the tongue every 5 (five) minutes as needed. For chest pain  25 tablet  3  . spironolactone (ALDACTONE) 25 MG tablet Take 1 tablet (25 mg total) by mouth daily.  30 tablet  3  . warfarin (COUMADIN) 2.5 MG tablet Take 2.5 mg by mouth once a week. On Wednesday.      . warfarin (COUMADIN) 5 MG tablet 1 tablet every day except for 1/2 tablet on Wednesdays or as directed by coumadin clinic  35 tablet  3  . sildenafil (VIAGRA) 50 MG tablet Take 1 tablet (50 mg total) by mouth daily as needed for erectile dysfunction.  10 tablet  3   No current facility-administered medications for this visit.    No Known Allergies  History   Social History  . Marital Status: Single    Spouse Name: N/A    Number of Children: N/A  . Years of Education: N/A   Occupational History  . Not on file.   Social History Main Topics  . Smoking status: Former Smoker -- 1.00 packs/day for 44 years    Types: Cigarettes    Quit date: 02/27/2011  . Smokeless tobacco: Former Neurosurgeon    Types: Chew    Quit date: 02/27/2011  . Alcohol Use: Yes     Comment: "stopped drinking alcohol 02/27/11; had been drinking 1 mixed drink qd 365 days/yr""  . Drug Use: Yes    Special: Marijuana     Comment: "used pot ages [age]"  . Sexual Activity: Yes   Other Topics Concern  . Not on file   Social History Narrative  . No narrative on file    Family History  Problem Relation Age of Onset  . Hypertension Mother   . Heart attack Mother 64  . Stroke Father     5 STROKES AND MI IN HIS 80'S  . Heart attack Father 39    Review of Systems:  As stated in the HPI and otherwise negative.   BP 140/98  Pulse 76  Ht 6' (1.829 m)  Wt 199 lb (90.266 kg)  BMI 26.98 kg/m2  Physical Examination: General: Well developed, well nourished, NAD HEENT: OP clear, mucus membranes moist SKIN: warm, dry. No rashes. Neuro: No focal deficits Musculoskeletal: Muscle strength 5/5 all ext Psychiatric: Mood and  affect normal Neck: No JVD, no carotid bruits, no thyromegaly, no lymphadenopathy. Lungs:Clear bilaterally, no wheezes, rhonci, crackles Cardiovascular: Irreg irreg No murmurs, gallops or rubs. Abdomen:Soft. Bowel sounds present. Non-tender.  Extremities: No lower extremity edema. Pulses are 2 + in the bilateral DP/PT.  EKG:  Atrial fib, rate 76 bpm.   Assessment and Plan:   1. Atrial fibrillation: Rate controlled. Continue current therapy including coumadin. We have discussed one of the newer agents but he refuses. We also discussed repeat DCCV which he has declined up until now. He is tolerating the atrial fib so well that I am not sure we should try repeat DCCV at this time. He is also asking about ablation. Will discuss with EP and try to decide best treatment option at this time since he is tolerating his atrial fib so well.   2. CAD: Stable. No changes. Continue ASA, Beta blocker, Imdur, statin, Ace-inh.   3. Ischemic cardiomyopathy: LVEF is now normal. On good therapy. Continue beta blocker and Ace-inh.   4. Tobacco abuse, in remission: He has stopped smoking.   5. HTN: Hypotensive at night. Will change Lisinopril to 5mg  daily in am.

## 2013-12-13 ENCOUNTER — Ambulatory Visit (INDEPENDENT_AMBULATORY_CARE_PROVIDER_SITE_OTHER): Payer: Medicare Other | Admitting: Pharmacist

## 2013-12-13 DIAGNOSIS — Z7901 Long term (current) use of anticoagulants: Secondary | ICD-10-CM | POA: Diagnosis not present

## 2013-12-13 DIAGNOSIS — I4891 Unspecified atrial fibrillation: Secondary | ICD-10-CM

## 2013-12-13 LAB — POCT INR: INR: 3.4

## 2013-12-20 ENCOUNTER — Other Ambulatory Visit: Payer: Self-pay | Admitting: *Deleted

## 2013-12-20 MED ORDER — METOPROLOL TARTRATE 100 MG PO TABS: 100.0000 mg | ORAL_TABLET | Freq: Two times a day (BID) | ORAL | Status: DC

## 2013-12-28 IMAGING — CR DG RIBS W/ CHEST 3+V*L*
4 series · 4 of 4 positions shown · non-contrast
Comparison: None.

CLINICAL DATA: Left rib pain

LEFT RIBS AND CHEST - 3+ VIEW

[w chest pa]
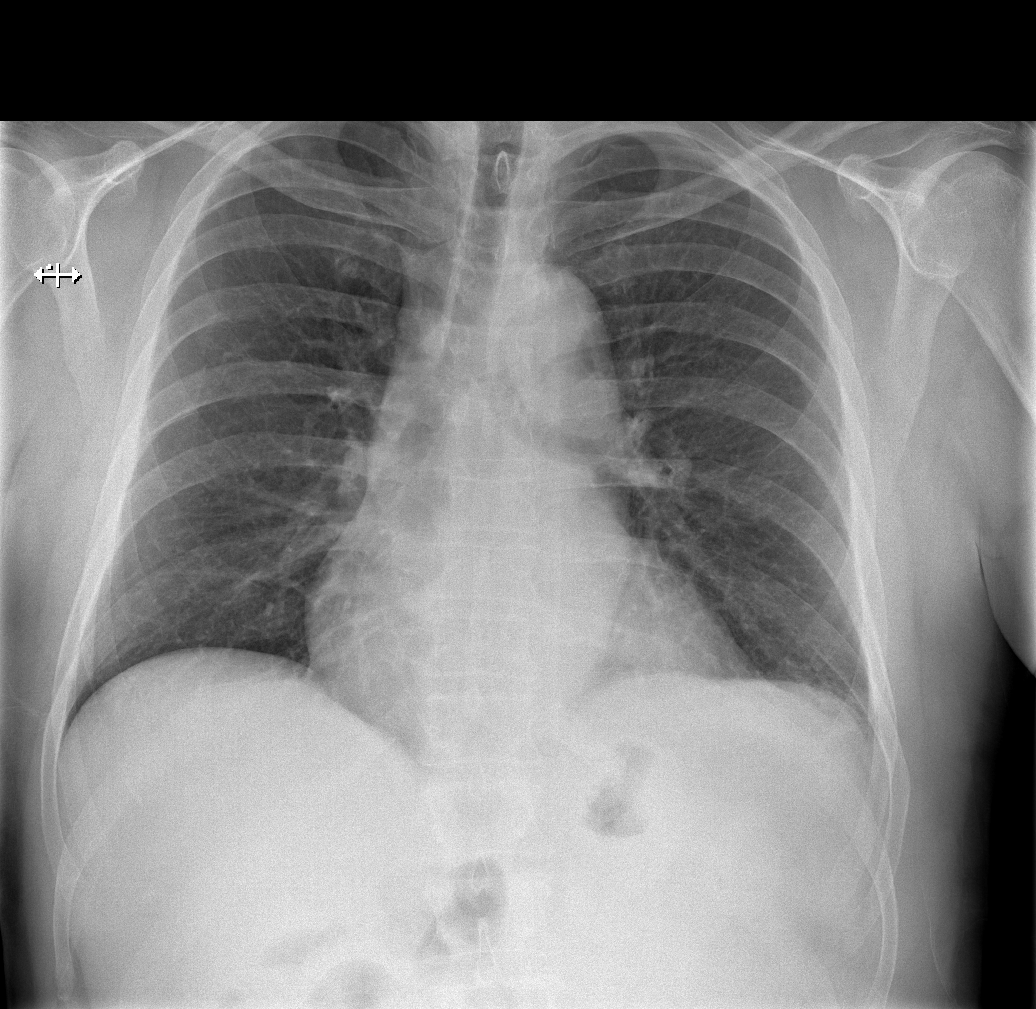

[w ribs ap upper left]
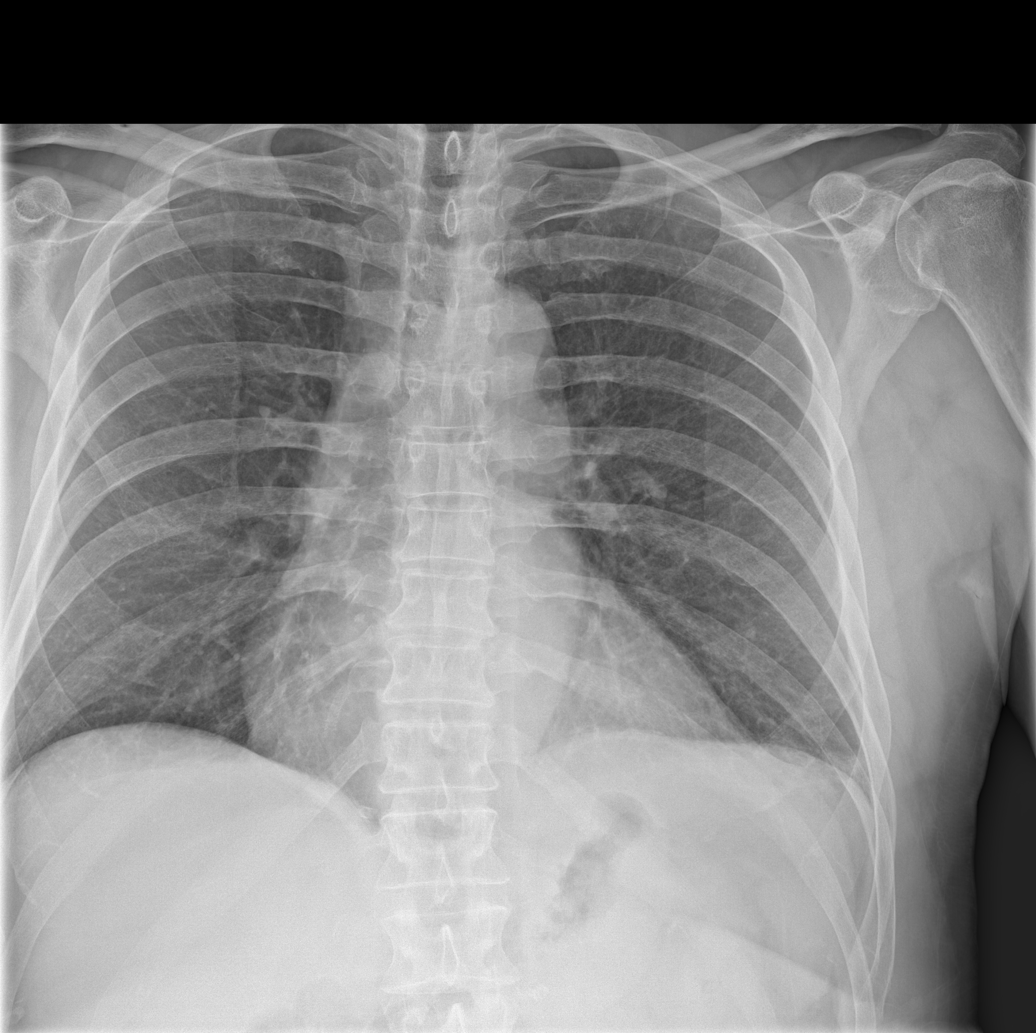

[w ribs ap lower left]
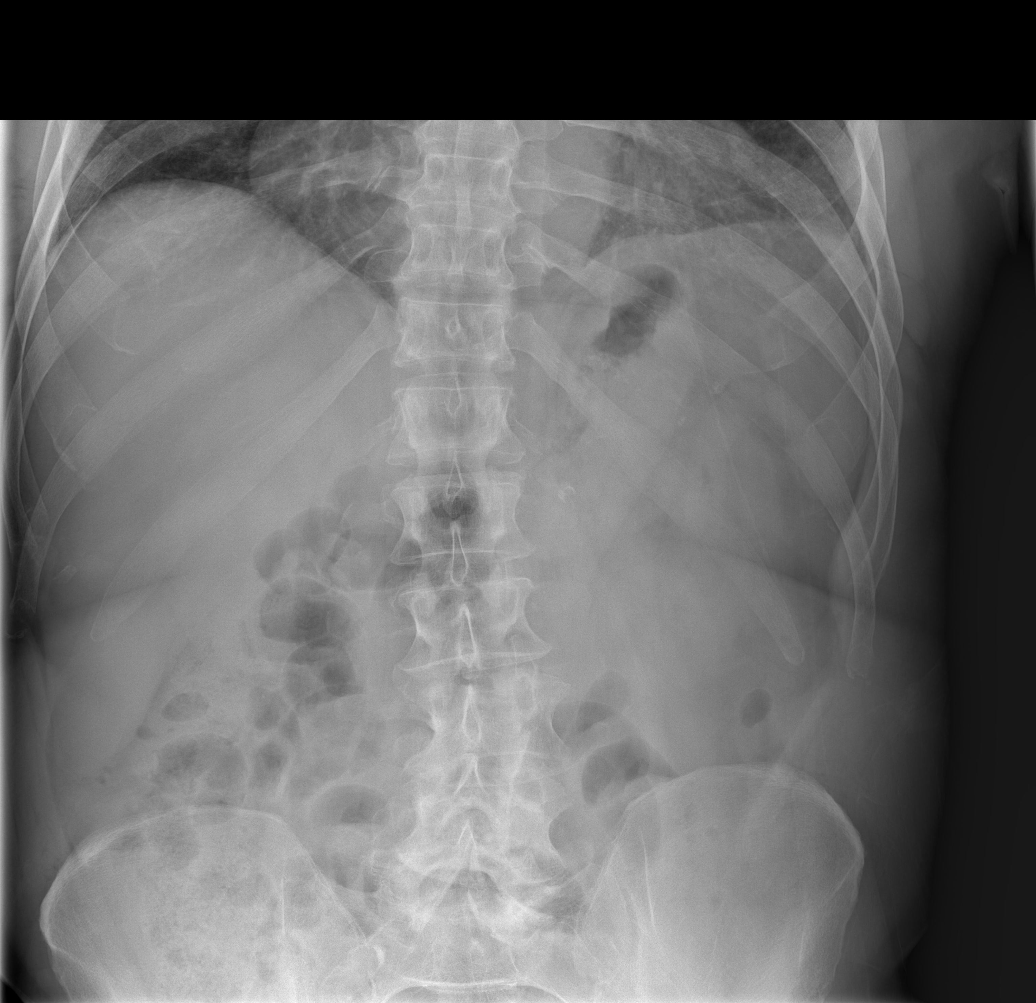

[w ribs obl left]
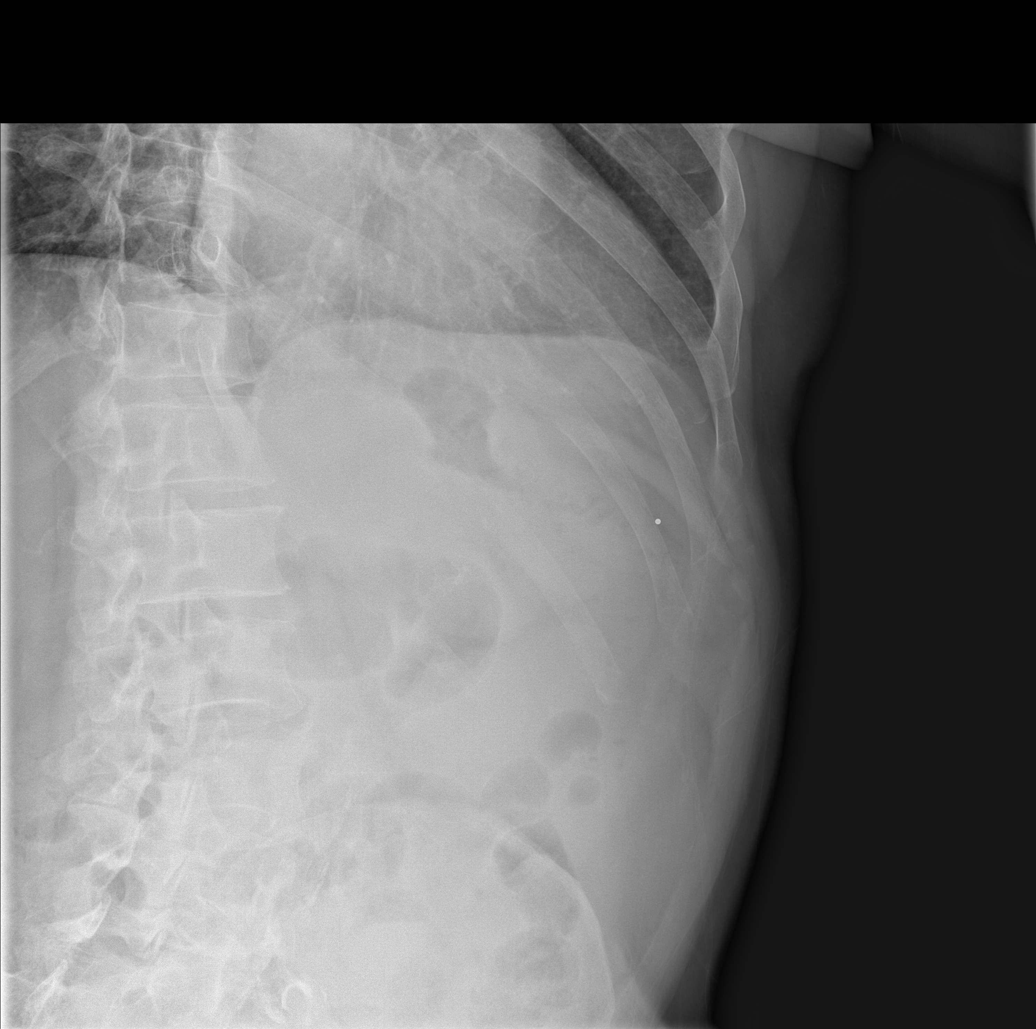

[4 of 4 positions shown; findings below may reference images not displayed]

FINDINGS: Lungs are essentially clear.  No pleural effusion or
pneumothorax.

Heart is top normal in size.

No displaced left rib fracture is seen.
IMPRESSION: No displaced left rib fracture is seen.

## 2014-01-03 ENCOUNTER — Ambulatory Visit (INDEPENDENT_AMBULATORY_CARE_PROVIDER_SITE_OTHER): Payer: Medicare Other | Admitting: Pharmacist

## 2014-01-03 DIAGNOSIS — Z7901 Long term (current) use of anticoagulants: Secondary | ICD-10-CM | POA: Diagnosis not present

## 2014-01-03 DIAGNOSIS — I4891 Unspecified atrial fibrillation: Secondary | ICD-10-CM | POA: Diagnosis not present

## 2014-01-03 LAB — POCT INR: INR: 2.8

## 2014-01-17 ENCOUNTER — Other Ambulatory Visit: Payer: Self-pay

## 2014-01-17 MED ORDER — SPIRONOLACTONE 25 MG PO TABS: 25.0000 mg | ORAL_TABLET | Freq: Every day | ORAL | Status: DC

## 2014-02-07 ENCOUNTER — Ambulatory Visit (INDEPENDENT_AMBULATORY_CARE_PROVIDER_SITE_OTHER): Payer: Medicare Other | Admitting: *Deleted

## 2014-02-07 ENCOUNTER — Telehealth: Payer: Self-pay | Admitting: *Deleted

## 2014-02-07 DIAGNOSIS — Z7901 Long term (current) use of anticoagulants: Secondary | ICD-10-CM

## 2014-02-07 DIAGNOSIS — I4891 Unspecified atrial fibrillation: Secondary | ICD-10-CM

## 2014-02-07 DIAGNOSIS — I10 Essential (primary) hypertension: Secondary | ICD-10-CM

## 2014-02-07 LAB — POCT INR: INR: 2

## 2014-02-07 NOTE — Telephone Encounter (Signed)
Pt was here for coumadin clinic appt earlier today and mentioned his blood pressure has been running lower in the evening. I called and spoke with pt. At last office visit his evening dose of Lisinopril was stopped due to hypotension in the evenings. After taking just once daily for a couple of weeks his blood pressure started going up in the evenings. Readings were 145/90's. He resumed previous dose of Lisinopril 5 mg twice daily about 3 weeks ago.  He now notes blood pressure being lower again in the evenings. Yesterday was 117/82 in the AM and 90/62 in the evening. He feels more tired in the evenings when blood pressure lower but otherwise not having problems.  Will forward to Dr. Clifton JamesMcAlhany for recommendations. Pt aware he is out of office this week.

## 2014-02-09 MED ORDER — LISINOPRIL 2.5 MG PO TABS: 2.5000 mg | ORAL_TABLET | Freq: Two times a day (BID) | ORAL | Status: DC

## 2014-02-09 NOTE — Telephone Encounter (Signed)
I would reduce dose to Lisinopril 2.5 mg po BID. cdm

## 2014-02-09 NOTE — Telephone Encounter (Signed)
Spoke with pt and gave him instructions from Dr. Clifton JamesMcAlhany. Will send new prescription to [facility].

## 2014-04-05 ENCOUNTER — Encounter (HOSPITAL_COMMUNITY): Payer: Self-pay | Admitting: Cardiovascular Disease

## 2014-04-09 ENCOUNTER — Ambulatory Visit (INDEPENDENT_AMBULATORY_CARE_PROVIDER_SITE_OTHER): Payer: Medicare Other | Admitting: *Deleted

## 2014-04-09 DIAGNOSIS — Z7901 Long term (current) use of anticoagulants: Secondary | ICD-10-CM

## 2014-04-09 DIAGNOSIS — I4891 Unspecified atrial fibrillation: Secondary | ICD-10-CM | POA: Diagnosis not present

## 2014-04-09 LAB — POCT INR: INR: 5

## 2014-04-13 ENCOUNTER — Other Ambulatory Visit: Payer: Self-pay | Admitting: *Deleted

## 2014-04-13 MED ORDER — METOPROLOL TARTRATE 100 MG PO TABS: 100.0000 mg | ORAL_TABLET | Freq: Two times a day (BID) | ORAL | Status: DC

## 2014-04-13 MED ORDER — WARFARIN SODIUM 5 MG PO TABS: ORAL_TABLET | ORAL | Status: DC

## 2014-04-17 ENCOUNTER — Ambulatory Visit (INDEPENDENT_AMBULATORY_CARE_PROVIDER_SITE_OTHER): Payer: Medicare Other | Admitting: *Deleted

## 2014-04-17 DIAGNOSIS — Z7901 Long term (current) use of anticoagulants: Secondary | ICD-10-CM | POA: Diagnosis not present

## 2014-04-17 DIAGNOSIS — I4891 Unspecified atrial fibrillation: Secondary | ICD-10-CM | POA: Diagnosis not present

## 2014-04-17 LAB — POCT INR: INR: 2.4

## 2014-05-15 ENCOUNTER — Ambulatory Visit (INDEPENDENT_AMBULATORY_CARE_PROVIDER_SITE_OTHER): Payer: Medicare Other | Admitting: *Deleted

## 2014-05-15 DIAGNOSIS — I4891 Unspecified atrial fibrillation: Secondary | ICD-10-CM | POA: Diagnosis not present

## 2014-05-15 DIAGNOSIS — Z7901 Long term (current) use of anticoagulants: Secondary | ICD-10-CM

## 2014-05-15 LAB — POCT INR: INR: 3.5

## 2014-05-17 ENCOUNTER — Other Ambulatory Visit: Payer: Self-pay

## 2014-05-17 MED ORDER — ATORVASTATIN CALCIUM 80 MG PO TABS: 80.0000 mg | ORAL_TABLET | Freq: Every day | ORAL | Status: DC

## 2014-05-25 ENCOUNTER — Encounter: Payer: Self-pay | Admitting: Cardiovascular Disease

## 2014-05-25 ENCOUNTER — Ambulatory Visit (INDEPENDENT_AMBULATORY_CARE_PROVIDER_SITE_OTHER): Payer: Medicare Other | Admitting: *Deleted

## 2014-05-25 ENCOUNTER — Ambulatory Visit (INDEPENDENT_AMBULATORY_CARE_PROVIDER_SITE_OTHER): Payer: Medicare Other | Admitting: Cardiovascular Disease

## 2014-05-25 VITALS — BP 133/88 | HR 79 | Ht 72.0 in | Wt 199.8 lb

## 2014-05-25 DIAGNOSIS — I2510 Atherosclerotic heart disease of native coronary artery without angina pectoris: Secondary | ICD-10-CM | POA: Diagnosis not present

## 2014-05-25 DIAGNOSIS — Z7901 Long term (current) use of anticoagulants: Secondary | ICD-10-CM | POA: Diagnosis not present

## 2014-05-25 DIAGNOSIS — I481 Persistent atrial fibrillation: Secondary | ICD-10-CM

## 2014-05-25 DIAGNOSIS — F17201 Nicotine dependence, unspecified, in remission: Secondary | ICD-10-CM | POA: Diagnosis not present

## 2014-05-25 DIAGNOSIS — I4891 Unspecified atrial fibrillation: Secondary | ICD-10-CM | POA: Diagnosis not present

## 2014-05-25 DIAGNOSIS — I10 Essential (primary) hypertension: Secondary | ICD-10-CM | POA: Diagnosis not present

## 2014-05-25 DIAGNOSIS — I4819 Other persistent atrial fibrillation: Secondary | ICD-10-CM

## 2014-05-25 DIAGNOSIS — I255 Ischemic cardiomyopathy: Secondary | ICD-10-CM

## 2014-05-25 LAB — POCT INR: INR: 3

## 2014-05-25 NOTE — Progress Notes (Signed)
History of Present Illness: 64 y.o. WM with history of CAD diagnosed November 2012 at time of inferior STEMI, persistent atrial fibrillation diagnosed November 2012, cardiomyopathy felt to be secondary to etoh and ischemia, tobacco abuse now in remission and etoh abuse in remission here today for cardiac follow up. He was admitted to [facility] November 2-8, 2012 with an an inferior STEMI. He was also noted to be in atrial fibrillation at the time of admission. Emergent LHC [facility] 02/27/11 with occluded RCA treated with a Promus DES in the proximal RCA. He was also found to have 50% ostial diagonal stenosis, 70% proximal Circumflex stenosis, 80% mid Circumflex stenosis, 40% stenoses in both obtuse marginal branches and a 99% distal Circumflex stenosis in a small caliber portion of the vessel after the last OM branch. The LAD had minimal plaque disease. LVEF was 35% by LVgram but f/u echo showed LVEF 15%. Echo 02/27/11: EF 15%, mod LVE, mild to mod MR, mod BAE, mod RVE, mod decreased RVSF, PASP 48, mild to mod pulmo HTN. Biventricular failure felt to be out of proportion to CAD and suspected to have mixed ICM/NICM possibly related to ETOH. I brought him in January 16,2013 and placed drug eluting stents in the proximal and mid Circumflex and in the distal RCA. He stopped smoking cigarettes and drinking alcohol. I started him on coumadin at his visit on 05/29/11. He has been following in the coumadin clinic. DCCV in March 2013. He converted to NSR and maintained NSR but converted back to atrial fibrillation in July 2013 but did not want repeat DCCV. Echo August 17, 2011 with resolution of LV function with LVEF of 50-55%. Normal ABI in January 2013. Lisinopril dose lowered due to hypotension in October 2015.   He is here today for follow up. No chest pain or SOB. No dizziness near syncope or syncope. He is not aware of any palpitations. He has been very active. He c/o pulling muscles because he is very  active. He has hematomas when this happens.   Primary Care Physician: None  Last Lipid Profile:Lipid Panel     Component Value Date/Time   CHOL 137 11/01/2013 0910   TRIG 148.0 11/01/2013 0910   HDL 36.40* 11/01/2013 0910   CHOLHDL 4 11/01/2013 0910   VLDL 29.6 11/01/2013 0910   LDLCALC 71 11/01/2013 0910    Past Medical History  Diagnosis Date  . CAD (coronary artery disease)     Inf STEMI 11/12: LHC [facility] 02[facility]11: oDx 50%, pCFX 70%, mCFX 80%, OM1 40%, oOM2 40%, dCFX 99% (small - 2mm), pRCA occluded, EF 35%.  He underwent PCI with Promus DES to the pRCA. s/p planned PCI w/ DES to prox Lcx, DES to mid LCx, DES to distal RCA 05/13/11.  . Ischemic cardiomyopathy     Echo 02/27/11: EF 15%, mod LVE, mild to mod MR, mod BAE, mod RVE, mod decreased RVSF, PASP 48, mild to mod pulmo HTN.  Marland Kitchen. Chronic systolic heart failure   . Atrial fibrillation     On coumadin  . Alcohol abuse   . Tobacco abuse   . HLD (hyperlipidemia)   . Hemorrhoids   . HTN (hypertension)   . Chronic lower back pain     "sciatic nerve"    Past Surgical History  Procedure Laterality Date  . Back surgery  ~ 1987    L4-5 ruptured disc  . Coronary angioplasty with stent placement  02/27/11    "1"  .  Coronary angioplasty with stent placement  05/13/11    "3"  . Cardioversion  07/02/2011    Procedure: CARDIOVERSION;  Surgeon: Kathleene Hazel, MD;  Location: [facility] OR;  Service: Cardiovascular;  Laterality: N/A;  . Percutaneous coronary stent intervention (pci-s) N/A 05/13/2011    Procedure: PERCUTANEOUS CORONARY STENT INTERVENTION (PCI-S);  Surgeon: Kathleene Hazel, MD;  Location: [facility] CATH LAB;  Service: Cardiovascular;  Laterality: N/A;    Current Outpatient Prescriptions  Medication Sig Dispense Refill  . acetaminophen (TYLENOL) 500 MG tablet Take 500 mg by mouth every 6 (six) hours as needed for pain.    Marland Kitchen aspirin EC 81 MG tablet Take 1 tablet (81 mg total) by mouth daily. 30 tablet 3  . atorvastatin  (LIPITOR) 80 MG tablet Take 1 tablet (80 mg total) by mouth daily. 90 tablet 0  . lisinopril (PRINIVIL,ZESTRIL) 2.5 MG tablet Take 1 tablet (2.5 mg total) by mouth 2 (two) times daily. 60 tablet 11  . metoprolol (LOPRESSOR) 100 MG tablet Take 1 tablet (100 mg total) by mouth 2 (two) times daily. 60 tablet 5  . nitroGLYCERIN (NITROSTAT) 0.4 MG SL tablet Place 1 tablet (0.4 mg total) under the tongue every 5 (five) minutes as needed. For chest pain 25 tablet 3  . spironolactone (ALDACTONE) 25 MG tablet Take 1 tablet (25 mg total) by mouth daily. 30 tablet 6  . warfarin (COUMADIN) 2.5 MG tablet Take 2.5 mg by mouth once a week. On Wednesday.    . warfarin (COUMADIN) 5 MG tablet 1 tablet every day except for 1/2 tablet on Wednesdays or as directed by coumadin clinic 35 tablet 3   No current facility-administered medications for this visit.    No Known Allergies  History   Social History  . Marital Status: Single    Spouse Name: N/A    Number of Children: N/A  . Years of Education: N/A   Occupational History  . Not on file.   Social History Main Topics  . Smoking status: Former Smoker -- 1.00 packs/day for 44 years    Types: Cigarettes    Quit date: 02/27/2011  . Smokeless tobacco: Former Neurosurgeon    Types: Chew    Quit date: 02/27/2011  . Alcohol Use: Yes     Comment: "stopped drinking alcohol 02/27/11; had been drinking 1 mixed drink qd 365 days/yr""  . Drug Use: Yes    Special: Marijuana     Comment: "used pot ages [age]"  . Sexual Activity: Yes   Other Topics Concern  . Not on file   Social History Narrative    Family History  Problem Relation Age of Onset  . Hypertension Mother   . Heart attack Mother 51  . Stroke Father     5 STROKES AND MI IN HIS 80'S  . Heart attack Father 75    Review of Systems:  As stated in the HPI and otherwise negative.   BP 133/88 mmHg  Pulse 79  Ht 6' (1.829 m)  Wt 199 lb 12.8 oz (90.629 kg)  BMI 27.09 kg/m2  SpO2 98%  Physical  Examination: General: Well developed, well nourished, NAD HEENT: OP clear, mucus membranes moist SKIN: warm, dry. No rashes. Neuro: No focal deficits Musculoskeletal: Muscle strength 5/5 all ext Psychiatric: Mood and affect normal Neck: No JVD, no carotid bruits, no thyromegaly, no lymphadenopathy. Lungs:Clear bilaterally, no wheezes, rhonci, crackles Cardiovascular: Irreg irreg No murmurs, gallops or rubs. Abdomen:Soft. Bowel sounds present. Non-tender.  Extremities: No lower extremity edema.  Pulses are 2 + in the bilateral DP/PT.  EKG:  Atrial fib, rate 80 bpm.  Assessment and Plan:   1. Atrial fibrillation: Rate controlled. Continue current therapy including coumadin. We have discussed one of the newer agents but he refuses. We also discussed repeat DCCV which he has declined up until now. He is tolerating the atrial fib so well that we will not plan DCCV.    2. CAD: Stable. No changes. Continue ASA, Beta blocker, Imdur, statin, Ace-inh.   3. Ischemic cardiomyopathy: LVEF is now normal. On good therapy. Continue beta blocker and Ace-inh.   4. Tobacco abuse, in remission: He has stopped smoking.   5. HTN: BP stable in lower dose Lisinopril.

## 2014-05-25 NOTE — Patient Instructions (Signed)
Your physician wants you to follow-up in:  6 months. You will receive a reminder letter in the mail two months in advance. If you don't receive a letter, please call our office to schedule the follow-up appointment.   

## 2014-06-15 ENCOUNTER — Ambulatory Visit (INDEPENDENT_AMBULATORY_CARE_PROVIDER_SITE_OTHER): Payer: Medicare Other | Admitting: *Deleted

## 2014-06-15 DIAGNOSIS — I4891 Unspecified atrial fibrillation: Secondary | ICD-10-CM

## 2014-06-15 DIAGNOSIS — Z7901 Long term (current) use of anticoagulants: Secondary | ICD-10-CM

## 2014-06-15 LAB — POCT INR: INR: 2.2

## 2014-07-12 ENCOUNTER — Ambulatory Visit (INDEPENDENT_AMBULATORY_CARE_PROVIDER_SITE_OTHER): Payer: Medicare Other | Admitting: Pharmacist Clinician (PhC)/ Clinical Pharmacy Specialist

## 2014-07-12 DIAGNOSIS — I4891 Unspecified atrial fibrillation: Secondary | ICD-10-CM

## 2014-07-12 DIAGNOSIS — Z7901 Long term (current) use of anticoagulants: Secondary | ICD-10-CM | POA: Diagnosis not present

## 2014-07-12 LAB — POCT INR: INR: 2

## 2014-08-09 ENCOUNTER — Ambulatory Visit (INDEPENDENT_AMBULATORY_CARE_PROVIDER_SITE_OTHER): Payer: Medicare Other | Admitting: Surgery

## 2014-08-09 DIAGNOSIS — Z7901 Long term (current) use of anticoagulants: Secondary | ICD-10-CM

## 2014-08-09 DIAGNOSIS — I4891 Unspecified atrial fibrillation: Secondary | ICD-10-CM | POA: Diagnosis not present

## 2014-08-09 LAB — POCT INR: INR: 2.9

## 2014-08-21 ENCOUNTER — Other Ambulatory Visit: Payer: Self-pay | Admitting: Cardiovascular Disease

## 2014-08-21 MED ORDER — ATORVASTATIN CALCIUM 80 MG PO TABS: 80.0000 mg | ORAL_TABLET | Freq: Every day | ORAL | Status: DC

## 2014-08-21 MED ORDER — SPIRONOLACTONE 25 MG PO TABS: 25.0000 mg | ORAL_TABLET | Freq: Every day | ORAL | Status: DC

## 2014-09-21 ENCOUNTER — Ambulatory Visit (INDEPENDENT_AMBULATORY_CARE_PROVIDER_SITE_OTHER): Payer: Medicare Other

## 2014-09-21 DIAGNOSIS — I4891 Unspecified atrial fibrillation: Secondary | ICD-10-CM | POA: Diagnosis not present

## 2014-09-21 DIAGNOSIS — Z7901 Long term (current) use of anticoagulants: Secondary | ICD-10-CM | POA: Diagnosis not present

## 2014-09-21 LAB — POCT INR: INR: 1.9

## 2014-10-11 ENCOUNTER — Other Ambulatory Visit: Payer: Self-pay

## 2014-10-11 MED ORDER — WARFARIN SODIUM 5 MG PO TABS: ORAL_TABLET | ORAL | Status: DC

## 2014-10-16 ENCOUNTER — Other Ambulatory Visit: Payer: Self-pay

## 2014-10-16 MED ORDER — METOPROLOL TARTRATE 100 MG PO TABS: 100.0000 mg | ORAL_TABLET | Freq: Two times a day (BID) | ORAL | Status: DC

## 2014-11-02 ENCOUNTER — Other Ambulatory Visit: Payer: Self-pay

## 2014-11-02 DIAGNOSIS — I10 Essential (primary) hypertension: Secondary | ICD-10-CM

## 2014-11-02 MED ORDER — LISINOPRIL 2.5 MG PO TABS: 2.5000 mg | ORAL_TABLET | Freq: Two times a day (BID) | ORAL | Status: DC

## 2014-11-05 ENCOUNTER — Ambulatory Visit (INDEPENDENT_AMBULATORY_CARE_PROVIDER_SITE_OTHER): Payer: Medicare Other

## 2014-11-05 DIAGNOSIS — Z7901 Long term (current) use of anticoagulants: Secondary | ICD-10-CM | POA: Diagnosis not present

## 2014-11-05 DIAGNOSIS — I4891 Unspecified atrial fibrillation: Secondary | ICD-10-CM | POA: Diagnosis not present

## 2014-11-05 LAB — POCT INR: INR: 2.4

## 2014-11-05 MED ORDER — METOPROLOL TARTRATE 100 MG PO TABS: 100.0000 mg | ORAL_TABLET | Freq: Two times a day (BID) | ORAL | Status: DC

## 2014-12-17 ENCOUNTER — Ambulatory Visit (INDEPENDENT_AMBULATORY_CARE_PROVIDER_SITE_OTHER): Payer: Medicare Other | Admitting: *Deleted

## 2014-12-17 ENCOUNTER — Ambulatory Visit (INDEPENDENT_AMBULATORY_CARE_PROVIDER_SITE_OTHER): Payer: Medicare Other | Admitting: Cardiovascular Disease

## 2014-12-17 ENCOUNTER — Encounter: Payer: Self-pay | Admitting: Cardiovascular Disease

## 2014-12-17 VITALS — BP 149/102 | HR 78 | Ht 72.0 in | Wt 202.0 lb

## 2014-12-17 DIAGNOSIS — I255 Ischemic cardiomyopathy: Secondary | ICD-10-CM

## 2014-12-17 DIAGNOSIS — Z7901 Long term (current) use of anticoagulants: Secondary | ICD-10-CM | POA: Diagnosis not present

## 2014-12-17 DIAGNOSIS — I481 Persistent atrial fibrillation: Secondary | ICD-10-CM | POA: Diagnosis not present

## 2014-12-17 DIAGNOSIS — I2510 Atherosclerotic heart disease of native coronary artery without angina pectoris: Secondary | ICD-10-CM | POA: Diagnosis not present

## 2014-12-17 DIAGNOSIS — I10 Essential (primary) hypertension: Secondary | ICD-10-CM | POA: Diagnosis not present

## 2014-12-17 DIAGNOSIS — I4819 Other persistent atrial fibrillation: Secondary | ICD-10-CM

## 2014-12-17 DIAGNOSIS — F17201 Nicotine dependence, unspecified, in remission: Secondary | ICD-10-CM

## 2014-12-17 DIAGNOSIS — I4891 Unspecified atrial fibrillation: Secondary | ICD-10-CM | POA: Diagnosis not present

## 2014-12-17 LAB — HEPATIC FUNCTION PANEL
ALT: 31 U/L (ref 0–53)
AST: 25 U/L (ref 0–37)
Albumin: 4 g/dL (ref 3.5–5.2)
Alkaline Phosphatase: 65 U/L (ref 39–117)
BILIRUBIN TOTAL: 0.5 mg/dL (ref 0.2–1.2)
Bilirubin, Direct: 0.1 mg/dL (ref 0.0–0.3)
Total Protein: 6.9 g/dL (ref 6.0–8.3)

## 2014-12-17 LAB — LIPID PANEL
CHOL/HDL RATIO: 4
Cholesterol: 146 mg/dL (ref 0–200)
HDL: 36.5 mg/dL — ABNORMAL LOW (ref >39.00)
LDL CALC: 76 mg/dL (ref 0–99)
NONHDL: 109.83
Triglycerides: 171 mg/dL — ABNORMAL HIGH (ref 0.0–149.0)
VLDL: 34.2 mg/dL (ref 0.0–40.0)

## 2014-12-17 LAB — POCT INR: INR: 3.3

## 2014-12-17 NOTE — Progress Notes (Signed)
Chief Complaint  Patient presents with  . Leg Pain     History of Present Illness: 64 y.o. WM with history of CAD diagnosed November 2012 at time of inferior STEMI, persistent atrial fibrillation diagnosed November 2012, cardiomyopathy felt to be secondary to etoh and ischemia, tobacco abuse now in remission and etoh abuse in remission here today for cardiac follow up. He was admitted to [facility] November 2-8, 2012 with an an inferior STEMI. He was also noted to be in atrial fibrillation at the time of admission. Emergent LHC at [facility] 02/27/11 with occluded RCA treated with a Promus DES in the proximal RCA. He was also found to have 50% ostial diagonal stenosis, 70% proximal Circumflex stenosis, 80% mid Circumflex stenosis, 40% stenoses in both obtuse marginal branches and a 99% distal Circumflex stenosis in a small caliber portion of the vessel after the last OM branch. The LAD had minimal plaque disease. LVEF was 35% by LVgram but f/u echo showed LVEF 15%. Echo 02/27/11: EF 15%, mod LVE, mild to mod MR, mod BAE, mod RVE, mod decreased RVSF, PASP 48, mild to mod pulmo HTN. Biventricular failure felt to be out of proportion to CAD and suspected to have mixed ICM/NICM possibly related to ETOH. I brought him in January 16,2013 and placed drug eluting stents in the proximal and mid Circumflex and in the distal RCA. He stopped smoking cigarettes and drinking alcohol. I started him on coumadin at his visit on 05/29/11. He has been following in the coumadin clinic. DCCV in March 2013. He converted to NSR and maintained NSR but converted back to atrial fibrillation in July 2013 but did not want repeat DCCV. Echo August 17, 2011 with resolution of LV function with LVEF of 50-55%. Normal ABI in January 2013. Lisinopril dose lowered due to hypotension in October 2015.   He is here today for follow up. No chest pain or SOB. No dizziness near syncope or syncope. He is not aware of any palpitations. He has been  very active. He c/o pulling muscles because he is very active. He has hematomas when this happens.   Primary Care Physician: None  Last Lipid Profile:Lipid Panel     Component Value Date/Time   CHOL 137 11/01/2013 0910   TRIG 148.0 11/01/2013 0910   HDL 36.40* 11/01/2013 0910   CHOLHDL 4 11/01/2013 0910   VLDL 29.6 11/01/2013 0910   LDLCALC 71 11/01/2013 0910    Past Medical History  Diagnosis Date  . CAD (coronary artery disease)     Inf STEMI 11/12: LHC at [facility] 02/27/11: oDx 50%, pCFX 70%, mCFX 80%, OM1 40%, oOM2 40%, dCFX 99% (small - 2mm), pRCA occluded, EF 35%.  He underwent PCI with Promus DES to the pRCA. s/p planned PCI w/ DES to prox Lcx, DES to mid LCx, DES to distal RCA 05/13/11.  . Ischemic cardiomyopathy     Echo 02/27/11: EF 15%, mod LVE, mild to mod MR, mod BAE, mod RVE, mod decreased RVSF, PASP 48, mild to mod pulmo HTN.  Marland Kitchen Chronic systolic heart failure   . Atrial fibrillation     On coumadin  . Alcohol abuse   . Tobacco abuse   . HLD (hyperlipidemia)   . Hemorrhoids   . HTN (hypertension)   . Chronic lower back pain     "sciatic nerve"    Past Surgical History  Procedure Laterality Date  . Back surgery  ~ 1987    L4-5 ruptured disc  .  Coronary angioplasty with stent placement  02/27/11    "1"  . Coronary angioplasty with stent placement  05/13/11    "3"  . Cardioversion  07/02/2011    Procedure: CARDIOVERSION;  Surgeon: Kathleene Hazel, MD;  Location: [facility] OR;  Service: Cardiovascular;  Laterality: N/A;  . Percutaneous coronary stent intervention (pci-s) N/A 05/13/2011    Procedure: PERCUTANEOUS CORONARY STENT INTERVENTION (PCI-S);  Surgeon: Kathleene Hazel, MD;  Location: [facility] CATH LAB;  Service: Cardiovascular;  Laterality: N/A;    Current Outpatient Prescriptions  Medication Sig Dispense Refill  . acetaminophen (TYLENOL) 500 MG tablet Take 500 mg by mouth every 6 (six) hours as needed for pain.    Marland Kitchen aspirin EC 81 MG tablet Take 1 tablet (81 mg  total) by mouth daily. 30 tablet 3  . atorvastatin (LIPITOR) 80 MG tablet Take 1 tablet (80 mg total) by mouth daily. 90 tablet 1  . lisinopril (PRINIVIL,ZESTRIL) 2.5 MG tablet Take 1 tablet (2.5 mg total) by mouth 2 (two) times daily. 60 tablet 6  . metoprolol (LOPRESSOR) 100 MG tablet Take 1 tablet (100 mg total) by mouth 2 (two) times daily. 60 tablet 0  . spironolactone (ALDACTONE) 25 MG tablet Take 1 tablet (25 mg total) by mouth daily. 30 tablet 6  . warfarin (COUMADIN) 2.5 MG tablet Take 2.5 mg by mouth once a week. On Wednesday.    . warfarin (COUMADIN) 5 MG tablet Take as directed by coumadin clinic 35 tablet 3  . nitroGLYCERIN (NITROSTAT) 0.4 MG SL tablet Place 1 tablet (0.4 mg total) under the tongue every 5 (five) minutes as needed. For chest pain 25 tablet 3   No current facility-administered medications for this visit.    No Known Allergies  Social History   Social History  . Marital Status: Single    Spouse Name: N/A  . Number of Children: N/A  . Years of Education: N/A   Occupational History  . Not on file.   Social History Main Topics  . Smoking status: Former Smoker -- 1.00 packs/day for 44 years    Types: Cigarettes    Quit date: 02/27/2011  . Smokeless tobacco: Former Neurosurgeon    Types: Chew    Quit date: 02/27/2011  . Alcohol Use: Yes     Comment: "stopped drinking alcohol 02/27/11; had been drinking 1 mixed drink qd 365 days/yr""  . Drug Use: Yes    Special: Marijuana     Comment: "used pot ages [age]"  . Sexual Activity: Yes   Other Topics Concern  . Not on file   Social History Narrative    Family History  Problem Relation Age of Onset  . Hypertension Mother   . Heart attack Mother 107  . Stroke Father     5 STROKES AND MI IN HIS 80'S  . Heart attack Father 32    Review of Systems:  As stated in the HPI and otherwise negative.   BP 149/102 mmHg  Pulse 78  Ht 6' (1.829 m)  Wt 202 lb (91.627 kg)  BMI 27.39 kg/m2  Physical  Examination: General: Well developed, well nourished, NAD HEENT: OP clear, mucus membranes moist SKIN: warm, dry. No rashes. Neuro: No focal deficits Musculoskeletal: Muscle strength 5/5 all ext Psychiatric: Mood and affect normal Neck: No JVD, no carotid bruits, no thyromegaly, no lymphadenopathy. Lungs:Clear bilaterally, no wheezes, rhonci, crackles Cardiovascular: Irreg irreg No murmurs, gallops or rubs. Abdomen:Soft. Bowel sounds present. Non-tender.  Extremities: No lower extremity edema. Pulses are  2 + in the bilateral DP/PT.  EKG:  EKG is not ordered today. The ekg ordered today demonstrates   Recent Labs: No results found for requested labs within last 365 days.   Lipid Panel    Component Value Date/Time   CHOL 137 11/01/2013 0910   TRIG 148.0 11/01/2013 0910   HDL 36.40* 11/01/2013 0910   CHOLHDL 4 11/01/2013 0910   VLDL 29.6 11/01/2013 0910   LDLCALC 71 11/01/2013 0910     Wt Readings from Last 3 Encounters:  12/17/14 202 lb (91.627 kg)  05/25/14 199 lb 12.8 oz (90.629 kg)  11/01/13 199 lb (90.266 kg)     Other studies Reviewed: Additional studies/ records that were reviewed today include: . Review of the above records demonstrates:    Assessment and Plan:   1. Atrial fibrillation: Rate controlled. Continue current therapy including coumadin. We have discussed one of the newer agents but he refuses. We also discussed repeat DCCV which he has declined up until now. He is tolerating the atrial fib so well that we will not plan DCCV.    2. CAD: Stable. No changes. Continue ASA, Beta blocker, Imdur, statin, Ace-inh.   3. Ischemic cardiomyopathy: LVEF is now normal. On good therapy. Continue beta blocker and Ace-inh.   4. Tobacco abuse, in remission: He has stopped smoking.   5. HTN: BP stable on lower dose Lisinopril. Well controlled at home.   Current medicines are reviewed at length with the patient today.  The patient does not have concerns regarding  medicines.  The following changes have been made:  no change  Labs/ tests ordered today include:   Orders Placed This Encounter  Procedures  . Lipid Profile  . Hepatic function panel    Disposition:   FU with me in 6 months  Signed, Verne Carrow, MD 12/17/2014 8:35 AM    [facility] [address] [place], [place], [place]  [address] Phone: [phone]; Fax: [phone]

## 2014-12-17 NOTE — Patient Instructions (Signed)
Medication Instructions:  Your physician recommends that you continue on your current medications as directed. Please refer to the Current Medication list given to you today.   Labwork: Lab work to be done today--Lipid and liver profiles   Testing/Procedures: none  Follow-Up: Your physician wants you to follow-up in: 6 months.  You will receive a reminder letter in the mail two months in advance. If you don't receive a letter, please call our office to schedule the follow-up appointment.

## 2014-12-21 ENCOUNTER — Other Ambulatory Visit: Payer: Self-pay | Admitting: *Deleted

## 2014-12-21 MED ORDER — METOPROLOL TARTRATE 100 MG PO TABS: 100.0000 mg | ORAL_TABLET | Freq: Two times a day (BID) | ORAL | Status: DC

## 2015-01-14 ENCOUNTER — Other Ambulatory Visit: Payer: Self-pay | Admitting: *Deleted

## 2015-01-14 MED ORDER — WARFARIN SODIUM 5 MG PO TABS: ORAL_TABLET | ORAL | Status: DC

## 2015-02-04 ENCOUNTER — Ambulatory Visit (INDEPENDENT_AMBULATORY_CARE_PROVIDER_SITE_OTHER): Payer: Medicare Other | Admitting: *Deleted

## 2015-02-04 DIAGNOSIS — I4891 Unspecified atrial fibrillation: Secondary | ICD-10-CM | POA: Diagnosis not present

## 2015-02-04 DIAGNOSIS — Z23 Encounter for immunization: Secondary | ICD-10-CM

## 2015-02-04 DIAGNOSIS — Z7901 Long term (current) use of anticoagulants: Secondary | ICD-10-CM

## 2015-02-04 LAB — POCT INR: INR: 1.9

## 2015-03-04 ENCOUNTER — Ambulatory Visit (INDEPENDENT_AMBULATORY_CARE_PROVIDER_SITE_OTHER): Payer: Medicare Other | Admitting: *Deleted

## 2015-03-04 DIAGNOSIS — I4891 Unspecified atrial fibrillation: Secondary | ICD-10-CM

## 2015-03-04 DIAGNOSIS — Z7901 Long term (current) use of anticoagulants: Secondary | ICD-10-CM

## 2015-03-04 LAB — POCT INR: INR: 3.4

## 2015-03-11 ENCOUNTER — Other Ambulatory Visit: Payer: Self-pay

## 2015-03-11 MED ORDER — ATORVASTATIN CALCIUM 80 MG PO TABS: 80.0000 mg | ORAL_TABLET | Freq: Every day | ORAL | Status: DC

## 2015-03-26 ENCOUNTER — Other Ambulatory Visit: Payer: Self-pay

## 2015-03-26 MED ORDER — SPIRONOLACTONE 25 MG PO TABS: 25.0000 mg | ORAL_TABLET | Freq: Every day | ORAL | Status: DC

## 2015-04-15 ENCOUNTER — Ambulatory Visit (INDEPENDENT_AMBULATORY_CARE_PROVIDER_SITE_OTHER): Payer: Medicare Other | Admitting: Pharmacist

## 2015-04-15 DIAGNOSIS — Z7901 Long term (current) use of anticoagulants: Secondary | ICD-10-CM | POA: Diagnosis not present

## 2015-04-15 DIAGNOSIS — I4891 Unspecified atrial fibrillation: Secondary | ICD-10-CM | POA: Diagnosis not present

## 2015-04-15 LAB — POCT INR: INR: 2.8

## 2015-04-28 DIAGNOSIS — F17200 Nicotine dependence, unspecified, uncomplicated: Secondary | ICD-10-CM | POA: Diagnosis not present

## 2015-04-28 DIAGNOSIS — J0100 Acute maxillary sinusitis, unspecified: Secondary | ICD-10-CM | POA: Diagnosis not present

## 2015-04-28 DIAGNOSIS — H7291 Unspecified perforation of tympanic membrane, right ear: Secondary | ICD-10-CM | POA: Diagnosis not present

## 2015-04-28 DIAGNOSIS — R030 Elevated blood-pressure reading, without diagnosis of hypertension: Secondary | ICD-10-CM | POA: Diagnosis not present

## 2015-05-27 ENCOUNTER — Ambulatory Visit (INDEPENDENT_AMBULATORY_CARE_PROVIDER_SITE_OTHER): Payer: Medicare Other | Admitting: *Deleted

## 2015-05-27 DIAGNOSIS — I4891 Unspecified atrial fibrillation: Secondary | ICD-10-CM

## 2015-05-27 DIAGNOSIS — Z7901 Long term (current) use of anticoagulants: Secondary | ICD-10-CM

## 2015-05-27 LAB — POCT INR: INR: 2.3

## 2015-05-29 ENCOUNTER — Other Ambulatory Visit: Payer: Self-pay

## 2015-05-29 DIAGNOSIS — I10 Essential (primary) hypertension: Secondary | ICD-10-CM

## 2015-05-29 MED ORDER — METOPROLOL TARTRATE 100 MG PO TABS: 100.0000 mg | ORAL_TABLET | Freq: Two times a day (BID) | ORAL | Status: DC

## 2015-05-29 MED ORDER — LISINOPRIL 2.5 MG PO TABS: 2.5000 mg | ORAL_TABLET | Freq: Two times a day (BID) | ORAL | Status: DC

## 2015-06-12 ENCOUNTER — Other Ambulatory Visit: Payer: Self-pay

## 2015-06-12 MED ORDER — ATORVASTATIN CALCIUM 80 MG PO TABS: 80.0000 mg | ORAL_TABLET | Freq: Every day | ORAL | Status: DC

## 2015-06-19 ENCOUNTER — Ambulatory Visit (INDEPENDENT_AMBULATORY_CARE_PROVIDER_SITE_OTHER): Payer: Medicare Other | Admitting: Cardiovascular Disease

## 2015-06-19 ENCOUNTER — Encounter: Payer: Self-pay | Admitting: Cardiovascular Disease

## 2015-06-19 ENCOUNTER — Ambulatory Visit (INDEPENDENT_AMBULATORY_CARE_PROVIDER_SITE_OTHER): Payer: Medicare Other | Admitting: *Deleted

## 2015-06-19 VITALS — BP 139/92 | HR 74 | Ht 72.0 in | Wt 200.0 lb

## 2015-06-19 DIAGNOSIS — I2510 Atherosclerotic heart disease of native coronary artery without angina pectoris: Secondary | ICD-10-CM

## 2015-06-19 DIAGNOSIS — F17201 Nicotine dependence, unspecified, in remission: Secondary | ICD-10-CM

## 2015-06-19 DIAGNOSIS — I10 Essential (primary) hypertension: Secondary | ICD-10-CM | POA: Diagnosis not present

## 2015-06-19 DIAGNOSIS — I255 Ischemic cardiomyopathy: Secondary | ICD-10-CM | POA: Diagnosis not present

## 2015-06-19 DIAGNOSIS — I482 Chronic atrial fibrillation, unspecified: Secondary | ICD-10-CM

## 2015-06-19 DIAGNOSIS — Z7901 Long term (current) use of anticoagulants: Secondary | ICD-10-CM | POA: Diagnosis not present

## 2015-06-19 DIAGNOSIS — I4891 Unspecified atrial fibrillation: Secondary | ICD-10-CM | POA: Diagnosis not present

## 2015-06-19 LAB — POCT INR: INR: 2.2

## 2015-06-19 MED ORDER — NITROGLYCERIN 0.4 MG SL SUBL: 0.4000 mg | SUBLINGUAL_TABLET | SUBLINGUAL | Status: DC | PRN

## 2015-06-19 NOTE — Progress Notes (Signed)
Chief Complaint  Patient presents with  . Follow-up  . Atrial Fibrillation     History of Present Illness: 65 y.o. WM with history of CAD diagnosed November 2012 at time of inferior STEMI, persistent atrial fibrillation diagnosed November 2012, cardiomyopathy felt to be secondary to etoh and ischemia, tobacco abuse now in remission and etoh abuse in remission here today for cardiac follow up. He was admitted to [facility] November 2-8, 2012 with an an inferior STEMI. He was also noted to be in atrial fibrillation at the time of admission. Emergent LHC at [facility] 02/27/11 with occluded RCA treated with a Promus DES in the proximal RCA. He was also found to have 50% ostial diagonal stenosis, 70% proximal Circumflex stenosis, 80% mid Circumflex stenosis, 40% stenoses in both obtuse marginal branches and a 99% distal Circumflex stenosis in a small caliber portion of the vessel after the last OM branch. The LAD had minimal plaque disease. LVEF was 35% by LVgram but f/u echo showed LVEF 15%. Echo 02/27/11: EF 15%, mod LVE, mild to mod MR, mod BAE, mod RVE, mod decreased RVSF, PASP 48, mild to mod pulmo HTN. Biventricular failure felt to be out of proportion to CAD and suspected to have mixed ICM/NICM possibly related to ETOH. I brought him in January 16,2013 and placed drug eluting stents in the proximal and mid Circumflex and in the distal RCA. He stopped smoking cigarettes and drinking alcohol. I started him on coumadin at his visit on 05/29/11. He has been following in the coumadin clinic. DCCV in March 2013. He converted to NSR and maintained NSR but converted back to atrial fibrillation in July 2013 but did not want repeat DCCV. Echo August 17, 2011 with resolution of LV function with LVEF of 50-55%. Normal ABI in January 2013. Lisinopril dose lowered due to hypotension in October 2015.   He is here today for follow up. No chest pain or SOB. No dizziness near syncope or syncope. He is not aware of any  palpitations. He has been very active.   Primary Care Physician: None  Past Medical History  Diagnosis Date  . CAD (coronary artery disease)     Inf STEMI 11/12: LHC at [facility] 02/27/11: oDx 50%, pCFX 70%, mCFX 80%, OM1 40%, oOM2 40%, dCFX 99% (small - 2mm), pRCA occluded, EF 35%.  He underwent PCI with Promus DES to the pRCA. s/p planned PCI w/ DES to prox Lcx, DES to mid LCx, DES to distal RCA 05/13/11.  . Ischemic cardiomyopathy     Echo 02/27/11: EF 15%, mod LVE, mild to mod MR, mod BAE, mod RVE, mod decreased RVSF, PASP 48, mild to mod pulmo HTN.  Marland Kitchen Chronic systolic heart failure (HCC)   . Atrial fibrillation (HCC)     On coumadin  . Alcohol abuse   . Tobacco abuse   . HLD (hyperlipidemia)   . Hemorrhoids   . HTN (hypertension)   . Chronic lower back pain     "sciatic nerve"    Past Surgical History  Procedure Laterality Date  . Back surgery  ~ 1987    L4-5 ruptured disc  . Coronary angioplasty with stent placement  02/27/11    "1"  . Coronary angioplasty with stent placement  05/13/11    "3"  . Cardioversion  07/02/2011    Procedure: CARDIOVERSION;  Surgeon: Kathleene Hazel, MD;  Location: [facility] OR;  Service: Cardiovascular;  Laterality: N/A;  . Percutaneous coronary stent intervention (pci-s) N/A 05/13/2011  Procedure: PERCUTANEOUS CORONARY STENT INTERVENTION (PCI-S);  Surgeon: Kathleene Hazel, MD;  Location: [facility] CATH LAB;  Service: Cardiovascular;  Laterality: N/A;    Current Outpatient Prescriptions  Medication Sig Dispense Refill  . acetaminophen (TYLENOL) 500 MG tablet Take 500 mg by mouth every 6 (six) hours as needed for pain.    Marland Kitchen aspirin EC 81 MG tablet Take 1 tablet (81 mg total) by mouth daily. 30 tablet 3  . atorvastatin (LIPITOR) 80 MG tablet Take 1 tablet (80 mg total) by mouth daily. 90 tablet 0  . lisinopril (PRINIVIL,ZESTRIL) 2.5 MG tablet Take 1 tablet (2.5 mg total) by mouth 2 (two) times daily. 60 tablet 1  . metoprolol (LOPRESSOR) 100 MG tablet  Take 1 tablet (100 mg total) by mouth 2 (two) times daily. 60 tablet 1  . spironolactone (ALDACTONE) 25 MG tablet Take 1 tablet (25 mg total) by mouth daily. 30 tablet 2  . warfarin (COUMADIN) 5 MG tablet Take as directed by coumadin clinic 35 tablet 3  . nitroGLYCERIN (NITROSTAT) 0.4 MG SL tablet Place 1 tablet (0.4 mg total) under the tongue every 5 (five) minutes as needed. For chest pain 25 tablet 6   No current facility-administered medications for this visit.    No Known Allergies  Social History   Social History  . Marital Status: Single    Spouse Name: N/A  . Number of Children: N/A  . Years of Education: N/A   Occupational History  . Not on file.   Social History Main Topics  . Smoking status: Former Smoker -- 1.00 packs/day for 44 years    Types: Cigarettes    Quit date: 02/27/2011  . Smokeless tobacco: Former Neurosurgeon    Types: Chew    Quit date: 02/27/2011  . Alcohol Use: Yes     Comment: "stopped drinking alcohol 02/27/11; had been drinking 1 mixed drink qd 365 days/yr""  . Drug Use: Yes    Special: Marijuana     Comment: "used pot ages [age]"  . Sexual Activity: Yes   Other Topics Concern  . Not on file   Social History Narrative    Family History  Problem Relation Age of Onset  . Hypertension Mother   . Heart attack Mother 81  . Stroke Father     5 STROKES AND MI IN HIS 80'S  . Heart attack Father 40    Review of Systems:  As stated in the HPI and otherwise negative.   BP 139/92 mmHg  Pulse 74  Ht 6' (1.829 m)  Wt 200 lb (90.719 kg)  BMI 27.12 kg/m2  SpO2 96%  Physical Examination: General: Well developed, well nourished, NAD HEENT: OP clear, mucus membranes moist SKIN: warm, dry. No rashes. Neuro: No focal deficits Musculoskeletal: Muscle strength 5/5 all ext Psychiatric: Mood and affect normal Neck: No JVD, no carotid bruits, no thyromegaly, no lymphadenopathy. Lungs:Clear bilaterally, no wheezes, rhonci, crackles Cardiovascular: Irreg  irreg No murmurs, gallops or rubs. Abdomen:Soft. Bowel sounds present. Non-tender.  Extremities: No lower extremity edema. Pulses are 2 + in the bilateral DP/PT.  EKG:  EKG is ordered today. The ekg ordered today demonstrates atrial fib, rate 70 bpm.   Recent Labs: 12/17/2014: ALT 31   Lipid Panel    Component Value Date/Time   CHOL 146 12/17/2014 0814   TRIG 171.0* 12/17/2014 0814   HDL 36.50* 12/17/2014 0814   CHOLHDL 4 12/17/2014 0814   VLDL 34.2 12/17/2014 0814   LDLCALC 76 12/17/2014 6578  Wt Readings from Last 3 Encounters:  06/19/15 200 lb (90.719 kg)  12/17/14 202 lb (91.627 kg)  05/25/14 199 lb 12.8 oz (90.629 kg)     Other studies Reviewed: Additional studies/ records that were reviewed today include: . Review of the above records demonstrates:    Assessment and Plan:   1. Atrial fibrillation: Rate controlled. Continue current therapy including coumadin. We have discussed one of the newer agents but he refuses. Rate control strategy is working well.    2. CAD: Stable. No changes. Continue ASA, Beta blocker, Imdur, statin, Ace-inh.   3. Ischemic cardiomyopathy: LVEF is now normal. On good therapy. Continue beta blocker and Ace-inh.   4. Tobacco abuse, in remission: He has stopped smoking.   5. HTN: BP is well controlled on current therapy. We reviewed his meds in detail  Current medicines are reviewed at length with the patient today.  The patient does not have concerns regarding medicines.  The following changes have been made:  no change  Labs/ tests ordered today include:   Orders Placed This Encounter  Procedures  . EKG 12-Lead    Disposition:   FU with me in 6 months  Signed, Verne Carrow, MD 06/19/2015 12:11 PM    [facility] [address] [place], [place], [place]  [address] Phone: [phone]; Fax: [phone]

## 2015-06-19 NOTE — Patient Instructions (Addendum)

## 2015-06-27 ENCOUNTER — Other Ambulatory Visit: Payer: Self-pay

## 2015-06-27 MED ORDER — SPIRONOLACTONE 25 MG PO TABS: 25.0000 mg | ORAL_TABLET | Freq: Every day | ORAL | Status: DC

## 2015-07-18 ENCOUNTER — Ambulatory Visit (INDEPENDENT_AMBULATORY_CARE_PROVIDER_SITE_OTHER): Payer: Medicare Other | Admitting: *Deleted

## 2015-07-18 DIAGNOSIS — I4891 Unspecified atrial fibrillation: Secondary | ICD-10-CM | POA: Diagnosis not present

## 2015-07-18 DIAGNOSIS — Z7901 Long term (current) use of anticoagulants: Secondary | ICD-10-CM

## 2015-07-18 LAB — POCT INR: INR: 2.8

## 2015-07-25 ENCOUNTER — Other Ambulatory Visit: Payer: Self-pay | Admitting: *Deleted

## 2015-07-25 DIAGNOSIS — I10 Essential (primary) hypertension: Secondary | ICD-10-CM

## 2015-07-25 MED ORDER — LISINOPRIL 2.5 MG PO TABS: 2.5000 mg | ORAL_TABLET | Freq: Two times a day (BID) | ORAL | Status: DC

## 2015-08-22 ENCOUNTER — Ambulatory Visit (INDEPENDENT_AMBULATORY_CARE_PROVIDER_SITE_OTHER): Payer: Medicare Other | Admitting: Pharmacist

## 2015-08-22 DIAGNOSIS — I4891 Unspecified atrial fibrillation: Secondary | ICD-10-CM | POA: Diagnosis not present

## 2015-08-22 DIAGNOSIS — Z7901 Long term (current) use of anticoagulants: Secondary | ICD-10-CM

## 2015-08-22 LAB — POCT INR: INR: 2.1

## 2015-09-03 ENCOUNTER — Other Ambulatory Visit: Payer: Self-pay | Admitting: *Deleted

## 2015-09-03 MED ORDER — METOPROLOL TARTRATE 100 MG PO TABS: 100.0000 mg | ORAL_TABLET | Freq: Two times a day (BID) | ORAL | Status: DC

## 2015-09-24 ENCOUNTER — Other Ambulatory Visit: Payer: Self-pay | Admitting: *Deleted

## 2015-09-24 MED ORDER — WARFARIN SODIUM 5 MG PO TABS: ORAL_TABLET | ORAL | Status: DC

## 2015-10-03 ENCOUNTER — Ambulatory Visit (INDEPENDENT_AMBULATORY_CARE_PROVIDER_SITE_OTHER): Payer: Medicare Other | Admitting: Pharmacist

## 2015-10-03 DIAGNOSIS — I4891 Unspecified atrial fibrillation: Secondary | ICD-10-CM | POA: Diagnosis not present

## 2015-10-03 DIAGNOSIS — Z7901 Long term (current) use of anticoagulants: Secondary | ICD-10-CM

## 2015-10-03 LAB — POCT INR: INR: 2.6

## 2015-11-22 ENCOUNTER — Ambulatory Visit (INDEPENDENT_AMBULATORY_CARE_PROVIDER_SITE_OTHER): Payer: Medicare Other | Admitting: *Deleted

## 2015-11-22 DIAGNOSIS — Z7901 Long term (current) use of anticoagulants: Secondary | ICD-10-CM

## 2015-11-22 DIAGNOSIS — I4891 Unspecified atrial fibrillation: Secondary | ICD-10-CM | POA: Diagnosis not present

## 2015-11-22 LAB — POCT INR: INR: 2.2

## 2016-01-08 ENCOUNTER — Ambulatory Visit (INDEPENDENT_AMBULATORY_CARE_PROVIDER_SITE_OTHER): Payer: Medicare Other | Admitting: *Deleted

## 2016-01-08 ENCOUNTER — Ambulatory Visit (INDEPENDENT_AMBULATORY_CARE_PROVIDER_SITE_OTHER): Payer: Medicare Other | Admitting: Cardiovascular Disease

## 2016-01-08 ENCOUNTER — Encounter: Payer: Self-pay | Admitting: Cardiovascular Disease

## 2016-01-08 ENCOUNTER — Encounter (INDEPENDENT_AMBULATORY_CARE_PROVIDER_SITE_OTHER): Payer: Self-pay

## 2016-01-08 VITALS — BP 140/70 | HR 94 | Ht 76.0 in | Wt 202.0 lb

## 2016-01-08 DIAGNOSIS — I2510 Atherosclerotic heart disease of native coronary artery without angina pectoris: Secondary | ICD-10-CM | POA: Diagnosis not present

## 2016-01-08 DIAGNOSIS — I255 Ischemic cardiomyopathy: Secondary | ICD-10-CM | POA: Diagnosis not present

## 2016-01-08 DIAGNOSIS — I10 Essential (primary) hypertension: Secondary | ICD-10-CM

## 2016-01-08 DIAGNOSIS — Z7901 Long term (current) use of anticoagulants: Secondary | ICD-10-CM

## 2016-01-08 DIAGNOSIS — I4891 Unspecified atrial fibrillation: Secondary | ICD-10-CM

## 2016-01-08 DIAGNOSIS — F17201 Nicotine dependence, unspecified, in remission: Secondary | ICD-10-CM

## 2016-01-08 DIAGNOSIS — I481 Persistent atrial fibrillation: Secondary | ICD-10-CM

## 2016-01-08 DIAGNOSIS — I4819 Other persistent atrial fibrillation: Secondary | ICD-10-CM

## 2016-01-08 DIAGNOSIS — E785 Hyperlipidemia, unspecified: Secondary | ICD-10-CM

## 2016-01-08 LAB — POCT INR: INR: 2.8

## 2016-01-08 MED ORDER — ROSUVASTATIN CALCIUM 10 MG PO TABS: 10.0000 mg | ORAL_TABLET | Freq: Every day | ORAL | 11 refills | Status: DC

## 2016-01-08 NOTE — Patient Instructions (Signed)
Medication Instructions:  Your physician has recommended you make the following change in your medication:  Start Rosuvastatin 10 mg by mouth daily.    Labwork: Your physician recommends that you return for lab work in: 12 weeks--Lipid and liver profiles. This will be fasting   Testing/Procedures: Your physician has requested that you have an echocardiogram. Echocardiography is a painless test that uses sound waves to create images of your heart. It provides your doctor with information about the size and shape of your heart and how well your heart's chambers and valves are working. This procedure takes approximately one hour. There are no restrictions for this procedure.    Follow-Up: .Your physician wants you to follow-up in: 6 months.  You will receive a reminder letter in the mail two months in advance. If you don't receive a letter, please call our office to schedule the follow-up appointment.  Any Other Special Instructions Will Be Listed Below (If Applicable).     If you need a refill on your cardiac medications before your next appointment, please call your pharmacy.

## 2016-01-08 NOTE — Progress Notes (Signed)
Chief Complaint  Patient presents with  . Coronary Artery Disease     History of Present Illness: 65 y.o. WM with history of CAD diagnosed November 2012 at time of inferior STEMI, persistent atrial fibrillation diagnosed November 2012, cardiomyopathy felt to be secondary to etoh and ischemia, tobacco abuse now in remission and etoh abuse in remission here today for cardiac follow up. He was admitted to [facility] November 2012 with an an inferior STEMI. He was also noted to be in atrial fibrillation at the time of admission. Emergent LHC at [facility] 02/27/11 with occluded RCA treated with a Promus DES in the proximal RCA. He was also found to have 50% ostial diagonal stenosis, 70% proximal Circumflex stenosis, 80% mid Circumflex stenosis, 40% stenoses in both obtuse marginal branches and a 99% distal Circumflex stenosis in a small caliber portion of the vessel after the last OM branch. The LAD had minimal plaque disease. LVEF was 35% by LVgram but f/u echo showed LVEF 15%. Echo 02/27/11: EF 15%, mod LVE, mild to mod MR, mod BAE, mod RVE, mod decreased RVSF, PASP 48, mild to mod pulmo HTN. Biventricular failure felt to be out of proportion to CAD and suspected to have mixed ICM/NICM possibly related to ETOH. I brought him in January 16,2013 and placed drug eluting stents in the proximal and mid Circumflex and in the distal RCA. He stopped smoking cigarettes and drinking alcohol. I started him on coumadin at his visit on 05/29/11. He has been following in the coumadin clinic. DCCV in March 2013. He converted to NSR and maintained NSR but converted back to atrial fibrillation in July 2013 but did not want repeat DCCV. Echo August 17, 2011 with resolution of LV function with LVEF of 50-55%. Normal ABI in January 2013. Lisinopril dose lowered due to hypotension in October 2015.   He is here today for follow up. No chest pain or SOB. No dizziness, near syncope or syncope. He is not aware of any palpitations. He  has been very active. He has no LE edema.   Primary Care Physician: No PCP Per Patient   Past Medical History:  Diagnosis Date  . Alcohol abuse   . Atrial fibrillation (HCC)    On coumadin  . CAD (coronary artery disease)    Inf STEMI 11/12: LHC at [facility] 02/27/11: oDx 50%, pCFX 70%, mCFX 80%, OM1 40%, oOM2 40%, dCFX 99% (small - 2mm), pRCA occluded, EF 35%.  He underwent PCI with Promus DES to the pRCA. s/p planned PCI w/ DES to prox Lcx, DES to mid LCx, DES to distal RCA 05/13/11.  Marland Kitchen Chronic lower back pain    "sciatic nerve"  . Chronic systolic heart failure (HCC)   . Hemorrhoids   . HLD (hyperlipidemia)   . HTN (hypertension)   . Ischemic cardiomyopathy    Echo 02/27/11: EF 15%, mod LVE, mild to mod MR, mod BAE, mod RVE, mod decreased RVSF, PASP 48, mild to mod pulmo HTN.  . Tobacco abuse     Past Surgical History:  Procedure Laterality Date  . BACK SURGERY  ~ 1987   L4-5 ruptured disc  . CARDIOVERSION  07/02/2011   Procedure: CARDIOVERSION;  Surgeon: Kathleene Hazel, MD;  Location: [facility] OR;  Service: Cardiovascular;  Laterality: N/A;  . CORONARY ANGIOPLASTY WITH STENT PLACEMENT  02/27/11   "1"  . CORONARY ANGIOPLASTY WITH STENT PLACEMENT  05/13/11   "3"  . PERCUTANEOUS CORONARY STENT INTERVENTION (PCI-S) N/A 05/13/2011   Procedure:  PERCUTANEOUS CORONARY STENT INTERVENTION (PCI-S);  Surgeon: Kathleene Hazel, MD;  Location: [facility] CATH LAB;  Service: Cardiovascular;  Laterality: N/A;    Current Outpatient Prescriptions  Medication Sig Dispense Refill  . acetaminophen (TYLENOL) 500 MG tablet Take 500 mg by mouth every 6 (six) hours as needed for pain.    Marland Kitchen aspirin EC 81 MG tablet Take 1 tablet (81 mg total) by mouth daily. 30 tablet 3  . lisinopril (PRINIVIL,ZESTRIL) 2.5 MG tablet Take 1 tablet (2.5 mg total) by mouth 2 (two) times daily. 60 tablet 10  . metoprolol (LOPRESSOR) 100 MG tablet Take 1 tablet (100 mg total) by mouth 2 (two) times daily. 180 tablet 2  .  nitroGLYCERIN (NITROSTAT) 0.4 MG SL tablet Place 1 tablet (0.4 mg total) under the tongue every 5 (five) minutes as needed. For chest pain 25 tablet 6  . spironolactone (ALDACTONE) 25 MG tablet Take 1 tablet (25 mg total) by mouth daily. 30 tablet 6  . warfarin (COUMADIN) 5 MG tablet Take as directed by coumadin clinic 35 tablet 3  . rosuvastatin (CRESTOR) 10 MG tablet Take 1 tablet (10 mg total) by mouth daily. 30 tablet 11   No current facility-administered medications for this visit.     Allergies  Allergen Reactions  . Lipitor [Atorvastatin] Other (See Comments)    Numbness, aching, unable to sleep    Social History   Social History  . Marital status: Single    Spouse name: N/A  . Number of children: N/A  . Years of education: N/A   Occupational History  . Not on file.   Social History Main Topics  . Smoking status: Former Smoker    Packs/day: 1.00    Years: 44.00    Types: Cigarettes    Quit date: 02/27/2011  . Smokeless tobacco: Former Neurosurgeon    Types: Chew    Quit date: 02/27/2011  . Alcohol use Yes     Comment: "stopped drinking alcohol 02/27/11; had been drinking 1 mixed drink qd 365 days/yr""  . Drug use:     Types: Marijuana     Comment: "used pot ages [age]"  . Sexual activity: Yes   Other Topics Concern  . Not on file   Social History Narrative  . No narrative on file    Family History  Problem Relation Age of Onset  . Hypertension Mother   . Heart attack Mother 67  . Stroke Father     5 STROKES AND MI IN HIS 80'S  . Heart attack Father 63    Review of Systems:  As stated in the HPI and otherwise negative.   BP 140/70   Pulse 94   Ht 6\' 4"  (1.93 m)   Wt 202 lb (91.6 kg)   BMI 24.59 kg/m   Physical Examination: General: Well developed, well nourished, NAD  HEENT: OP clear, mucus membranes moist  SKIN: warm, dry. No rashes. Neuro: No focal deficits  Musculoskeletal: Muscle strength 5/5 all ext  Psychiatric: Mood and affect normal  Neck:  No JVD, no carotid bruits, no thyromegaly, no lymphadenopathy.  Lungs:Clear bilaterally, no wheezes, rhonci, crackles Cardiovascular: Irreg irreg No murmurs, gallops or rubs. Abdomen:Soft. Bowel sounds present. Non-tender.  Extremities: No lower extremity edema. Pulses are 2 + in the bilateral DP/PT.  EKG:  EKG is not ordered today. The ekg ordered today demonstrates    Recent Labs: No results found for requested labs within last 8760 hours.   Lipid Panel    Component  Value Date/Time   CHOL 146 12/17/2014 0814   TRIG 171.0 (H) 12/17/2014 0814   HDL 36.50 (L) 12/17/2014 0814   CHOLHDL 4 12/17/2014 0814   VLDL 34.2 12/17/2014 0814   LDLCALC 76 12/17/2014 0814     Wt Readings from Last 3 Encounters:  01/08/16 202 lb (91.6 kg)  06/19/15 200 lb (90.7 kg)  12/17/14 202 lb (91.6 kg)     Other studies Reviewed: Additional studies/ records that were reviewed today include: . Review of the above records demonstrates:    Assessment and Plan:   1. Atrial fibrillation: Rate controlled. Continue current therapy including coumadin. We have discussed one of the newer agents but he refuses. Rate control strategy is working well.    2. CAD: Stable. No changes. Continue ASA, Beta blocker, Imdur, statin, Ace-inh.   3. Ischemic cardiomyopathy: LVEF is now normal by echo 2013. On good therapy. Continue beta blocker and Ace-inh. Will repeat echo now.   4. Tobacco abuse, in remission: He has stopped smoking.   5. HTN: BP is well controlled on current therapy. No changes.   6. Hyperlipidemia: He did not tolerate high dose Lipitor. He stopped this 2 months ago. He is willing to try Crestor 10 mg daily. Repeat lipids and LFTs in 12 weeks.  Current medicines are reviewed at length with the patient today.  The patient does not have concerns regarding medicines.  The following changes have been made:  no change  Labs/ tests ordered today include:   Orders Placed This Encounter  Procedures    . Lipid Profile  . Hepatic function panel  . ECHOCARDIOGRAM COMPLETE    Disposition:   FU with me in 6 months  Signed, Verne Carrowhristopher Tashica Provencio, MD 01/08/2016 9:51 AM    [facility] [address], [place], [place]  [address] Phone: [phone]; Fax: [phone]

## 2016-01-20 ENCOUNTER — Ambulatory Visit (HOSPITAL_COMMUNITY): Payer: Medicare Other | Attending: Cardiovascular Disease

## 2016-01-20 ENCOUNTER — Other Ambulatory Visit: Payer: Self-pay

## 2016-01-20 DIAGNOSIS — I517 Cardiomegaly: Secondary | ICD-10-CM | POA: Diagnosis not present

## 2016-01-20 DIAGNOSIS — Z87891 Personal history of nicotine dependence: Secondary | ICD-10-CM | POA: Insufficient documentation

## 2016-01-20 DIAGNOSIS — I255 Ischemic cardiomyopathy: Secondary | ICD-10-CM

## 2016-01-20 DIAGNOSIS — I2510 Atherosclerotic heart disease of native coronary artery without angina pectoris: Secondary | ICD-10-CM | POA: Diagnosis not present

## 2016-01-20 DIAGNOSIS — I351 Nonrheumatic aortic (valve) insufficiency: Secondary | ICD-10-CM | POA: Insufficient documentation

## 2016-01-24 ENCOUNTER — Other Ambulatory Visit: Payer: Self-pay | Admitting: *Deleted

## 2016-01-24 MED ORDER — SPIRONOLACTONE 25 MG PO TABS: 25.0000 mg | ORAL_TABLET | Freq: Every day | ORAL | 3 refills | Status: DC

## 2016-02-19 ENCOUNTER — Ambulatory Visit (INDEPENDENT_AMBULATORY_CARE_PROVIDER_SITE_OTHER): Payer: Medicare Other | Admitting: *Deleted

## 2016-02-19 DIAGNOSIS — I4891 Unspecified atrial fibrillation: Secondary | ICD-10-CM

## 2016-02-19 DIAGNOSIS — Z7901 Long term (current) use of anticoagulants: Secondary | ICD-10-CM | POA: Diagnosis not present

## 2016-02-19 LAB — POCT INR: INR: 3.8

## 2016-03-09 ENCOUNTER — Other Ambulatory Visit: Payer: Self-pay | Admitting: *Deleted

## 2016-03-09 MED ORDER — WARFARIN SODIUM 5 MG PO TABS: ORAL_TABLET | ORAL | 3 refills | Status: DC

## 2016-03-18 ENCOUNTER — Ambulatory Visit (INDEPENDENT_AMBULATORY_CARE_PROVIDER_SITE_OTHER): Payer: Medicare Other | Admitting: *Deleted

## 2016-03-18 DIAGNOSIS — I255 Ischemic cardiomyopathy: Secondary | ICD-10-CM

## 2016-03-18 DIAGNOSIS — Z7901 Long term (current) use of anticoagulants: Secondary | ICD-10-CM | POA: Diagnosis not present

## 2016-03-18 DIAGNOSIS — I4891 Unspecified atrial fibrillation: Secondary | ICD-10-CM

## 2016-03-18 LAB — POCT INR: INR: 2

## 2016-04-01 ENCOUNTER — Other Ambulatory Visit (INDEPENDENT_AMBULATORY_CARE_PROVIDER_SITE_OTHER): Payer: Medicare Other

## 2016-04-01 DIAGNOSIS — E785 Hyperlipidemia, unspecified: Secondary | ICD-10-CM | POA: Diagnosis not present

## 2016-04-01 LAB — LIPID PANEL
CHOL/HDL RATIO: 5.2 ratio — AB (ref <5.0)
Cholesterol: 167 mg/dL (ref <200)
HDL: 32 mg/dL — ABNORMAL LOW (ref >40)
LDL Cholesterol: 95 mg/dL (ref <100)
TRIGLYCERIDES: 199 mg/dL — AB (ref <150)
VLDL: 40 mg/dL — AB (ref <30)

## 2016-04-01 LAB — HEPATIC FUNCTION PANEL
ALT: 15 U/L (ref 9–46)
AST: 19 U/L (ref 10–35)
Albumin: 4 g/dL (ref 3.6–5.1)
Alkaline Phosphatase: 52 U/L (ref 40–115)
BILIRUBIN INDIRECT: 0.5 mg/dL (ref 0.2–1.2)
Bilirubin, Direct: 0.1 mg/dL (ref <=0.2)
TOTAL PROTEIN: 6.5 g/dL (ref 6.1–8.1)
Total Bilirubin: 0.6 mg/dL (ref 0.2–1.2)

## 2016-04-29 ENCOUNTER — Ambulatory Visit (INDEPENDENT_AMBULATORY_CARE_PROVIDER_SITE_OTHER): Payer: Medicare Other | Admitting: *Deleted

## 2016-04-29 DIAGNOSIS — I4891 Unspecified atrial fibrillation: Secondary | ICD-10-CM | POA: Diagnosis not present

## 2016-04-29 DIAGNOSIS — Z7901 Long term (current) use of anticoagulants: Secondary | ICD-10-CM | POA: Diagnosis not present

## 2016-04-29 LAB — POCT INR: INR: 2.6

## 2016-05-28 ENCOUNTER — Other Ambulatory Visit: Payer: Self-pay | Admitting: Cardiovascular Disease

## 2016-05-28 DIAGNOSIS — I10 Essential (primary) hypertension: Secondary | ICD-10-CM

## 2016-05-28 NOTE — Telephone Encounter (Signed)
Rx refill sent to pharmacy. 

## 2016-06-04 ENCOUNTER — Other Ambulatory Visit: Payer: Self-pay | Admitting: Cardiovascular Disease

## 2016-06-10 ENCOUNTER — Ambulatory Visit (INDEPENDENT_AMBULATORY_CARE_PROVIDER_SITE_OTHER): Payer: Medicare Other | Admitting: *Deleted

## 2016-06-10 DIAGNOSIS — I4891 Unspecified atrial fibrillation: Secondary | ICD-10-CM

## 2016-06-10 DIAGNOSIS — Z7901 Long term (current) use of anticoagulants: Secondary | ICD-10-CM

## 2016-06-10 LAB — POCT INR: INR: 2.7

## 2016-07-03 ENCOUNTER — Other Ambulatory Visit: Payer: Self-pay | Admitting: Cardiovascular Disease

## 2016-07-03 DIAGNOSIS — I10 Essential (primary) hypertension: Secondary | ICD-10-CM

## 2016-07-22 ENCOUNTER — Encounter (INDEPENDENT_AMBULATORY_CARE_PROVIDER_SITE_OTHER): Payer: Self-pay

## 2016-07-22 ENCOUNTER — Ambulatory Visit (INDEPENDENT_AMBULATORY_CARE_PROVIDER_SITE_OTHER): Payer: Medicare Other | Admitting: *Deleted

## 2016-07-22 DIAGNOSIS — I4891 Unspecified atrial fibrillation: Secondary | ICD-10-CM

## 2016-07-22 DIAGNOSIS — Z7901 Long term (current) use of anticoagulants: Secondary | ICD-10-CM | POA: Diagnosis not present

## 2016-07-22 LAB — POCT INR: INR: 3.3

## 2016-08-14 ENCOUNTER — Encounter: Payer: Self-pay | Admitting: Cardiovascular Disease

## 2016-08-24 ENCOUNTER — Other Ambulatory Visit: Payer: Self-pay | Admitting: Cardiovascular Disease

## 2016-09-07 ENCOUNTER — Ambulatory Visit (INDEPENDENT_AMBULATORY_CARE_PROVIDER_SITE_OTHER): Payer: Medicare Other | Admitting: Cardiovascular Disease

## 2016-09-07 ENCOUNTER — Encounter: Payer: Self-pay | Admitting: Cardiovascular Disease

## 2016-09-07 ENCOUNTER — Ambulatory Visit (INDEPENDENT_AMBULATORY_CARE_PROVIDER_SITE_OTHER): Payer: Medicare Other

## 2016-09-07 ENCOUNTER — Encounter (INDEPENDENT_AMBULATORY_CARE_PROVIDER_SITE_OTHER): Payer: Self-pay

## 2016-09-07 VITALS — BP 120/64 | HR 66 | Ht 76.0 in | Wt 203.4 lb

## 2016-09-07 DIAGNOSIS — I10 Essential (primary) hypertension: Secondary | ICD-10-CM | POA: Diagnosis not present

## 2016-09-07 DIAGNOSIS — Z7901 Long term (current) use of anticoagulants: Secondary | ICD-10-CM

## 2016-09-07 DIAGNOSIS — F17201 Nicotine dependence, unspecified, in remission: Secondary | ICD-10-CM | POA: Diagnosis not present

## 2016-09-07 DIAGNOSIS — E7800 Pure hypercholesterolemia, unspecified: Secondary | ICD-10-CM

## 2016-09-07 DIAGNOSIS — I481 Persistent atrial fibrillation: Secondary | ICD-10-CM | POA: Diagnosis not present

## 2016-09-07 DIAGNOSIS — I4819 Other persistent atrial fibrillation: Secondary | ICD-10-CM

## 2016-09-07 DIAGNOSIS — I2510 Atherosclerotic heart disease of native coronary artery without angina pectoris: Secondary | ICD-10-CM

## 2016-09-07 DIAGNOSIS — I4891 Unspecified atrial fibrillation: Secondary | ICD-10-CM | POA: Diagnosis not present

## 2016-09-07 DIAGNOSIS — I255 Ischemic cardiomyopathy: Secondary | ICD-10-CM | POA: Diagnosis not present

## 2016-09-07 LAB — POCT INR: INR: 2.7

## 2016-09-07 MED ORDER — LISINOPRIL 2.5 MG PO TABS: 2.5000 mg | ORAL_TABLET | Freq: Every day | ORAL | 11 refills | Status: DC

## 2016-09-07 NOTE — Patient Instructions (Signed)
Medication Instructions:  Your physician has recommended you make the following change in your medication: Decrease lisinopril to 2.5 mg by mouth daily.    Labwork: none  Testing/Procedures: none  Follow-Up: Your physician recommends that you schedule a follow-up appointment in: 6 months. Please call our office in about 3 months to schedule this appointment    Any Other Special Instructions Will Be Listed Below (If Applicable).     If you need a refill on your cardiac medications before your next appointment, please call your pharmacy.

## 2016-09-07 NOTE — Progress Notes (Signed)
Chief Complaint  Patient presents with  . Follow-up    CAD     History of Present Illness: 66 yo male with history of CAD, persistent atrial fibrillation, cardiomyopathy here today for cardiac follow up. He had an inferior STEMI in  November 2012 with cardiomyopathy felt to be out of proportion to his CAD. At that time he reported heavy alcohol and tobacco abuse. He was in atrial fib at the time of admission. Cardiac cath with occluded RCA treated with a drug eluting stent. He was also found to have 50% ostial diagonal stenosis, 70% proximal Circumflex stenosis, 80% mid Circumflex stenosis, 40% stenoses in both obtuse marginal branches. LVEF as low as 15% in 2012. He has staged PCI with placement of drug eluting stents in the proximal and mid Circumflex and distal RCA in January 2013. He stopped drinking alcohol and tobacco. LVEF improved to 55% by echo April 2013. He underwent cardioversion in 2013 but converted back to atrial fib later that year. He has remained in atrial fib since then on coumadin. Normal ABI 2013. Echo September 2017 with LVEF 40-45%, no significant valve disease.   He is here today for follow up. The patient denies any chest pain, dyspnea, palpitations, lower extremity edema, orthopnea, PND, dizziness, near syncope or syncope.   Primary Care Physician: Patient, No Pcp Per  Past Medical History:  Diagnosis Date  . Alcohol abuse   . Atrial fibrillation (HCC)    On coumadin  . CAD (coronary artery disease)    Inf STEMI 11/12: LHC at [facility] 02/27/11: oDx 50%, pCFX 70%, mCFX 80%, OM1 40%, oOM2 40%, dCFX 99% (small - 2mm), pRCA occluded, EF 35%.  He underwent PCI with Promus DES to the pRCA. s/p planned PCI w/ DES to prox Lcx, DES to mid LCx, DES to distal RCA 05/13/11.  Marland Kitchen Chronic lower back pain    "sciatic nerve"  . Chronic systolic heart failure (HCC)   . Hemorrhoids   . HLD (hyperlipidemia)   . HTN (hypertension)   . Ischemic cardiomyopathy    Echo 02/27/11: EF 15%, mod  LVE, mild to mod MR, mod BAE, mod RVE, mod decreased RVSF, PASP 48, mild to mod pulmo HTN.  . Tobacco abuse     Past Surgical History:  Procedure Laterality Date  . BACK SURGERY  ~ 1987   L4-5 ruptured disc  . CARDIOVERSION  07/02/2011   Procedure: CARDIOVERSION;  Surgeon: Kathleene Hazel, MD;  Location: [facility] OR;  Service: Cardiovascular;  Laterality: N/A;  . CORONARY ANGIOPLASTY WITH STENT PLACEMENT  02/27/11   "1"  . CORONARY ANGIOPLASTY WITH STENT PLACEMENT  05/13/11   "3"  . PERCUTANEOUS CORONARY STENT INTERVENTION (PCI-S) N/A 05/13/2011   Procedure: PERCUTANEOUS CORONARY STENT INTERVENTION (PCI-S);  Surgeon: Kathleene Hazel, MD;  Location: [facility] CATH LAB;  Service: Cardiovascular;  Laterality: N/A;    Current Outpatient Prescriptions  Medication Sig Dispense Refill  . acetaminophen (TYLENOL) 500 MG tablet Take 500 mg by mouth every 6 (six) hours as needed for pain.    Marland Kitchen aspirin EC 81 MG tablet Take 1 tablet (81 mg total) by mouth daily. 30 tablet 3  . metoprolol (LOPRESSOR) 100 MG tablet TAKE ONE TABLET BY MOUTH TWICE DAILY 180 tablet 1  . spironolactone (ALDACTONE) 25 MG tablet Take 1 tablet (25 mg total) by mouth daily. 90 tablet 3  . warfarin (COUMADIN) 5 MG tablet TAKE AS DIRECTED BY COUMADIN CLINIC 35 tablet 3  . lisinopril (PRINIVIL,ZESTRIL) 2.5 MG  tablet Take 1 tablet (2.5 mg total) by mouth daily. 30 tablet 11  . nitroGLYCERIN (NITROSTAT) 0.4 MG SL tablet Place 1 tablet (0.4 mg total) under the tongue every 5 (five) minutes as needed. For chest pain 25 tablet 6  . rosuvastatin (CRESTOR) 10 MG tablet Take 1 tablet (10 mg total) by mouth daily. 30 tablet 11   No current facility-administered medications for this visit.     Allergies  Allergen Reactions  . Lipitor [Atorvastatin] Other (See Comments)    Numbness, aching, unable to sleep    Social History   Social History  . Marital status: Single    Spouse name: N/A  . Number of children: N/A  . Years of  education: N/A   Occupational History  . Not on file.   Social History Main Topics  . Smoking status: Former Smoker    Packs/day: 1.00    Years: 44.00    Types: Cigarettes    Quit date: 02/27/2011  . Smokeless tobacco: Former NeurosurgeonUser    Types: Chew    Quit date: 02/27/2011  . Alcohol use Yes     Comment: "stopped drinking alcohol 02/27/11; had been drinking 1 mixed drink qd 365 days/yr""  . Drug use: Yes    Types: Marijuana     Comment: "used pot ages 8916-20"  . Sexual activity: Yes   Other Topics Concern  . Not on file   Social History Narrative  . No narrative on file    Family History  Problem Relation Age of Onset  . Hypertension Mother   . Heart attack Mother [id number]  . Stroke Father        5 STROKES AND MI IN HIS 80'S  . Heart attack Father [id number]    Review of Systems:  As stated in the HPI and otherwise negative.   BP 120/64   Pulse 66   Ht 6\' 4"  (1.93 m)   Wt 203 lb 6.4 oz (92.3 kg)   SpO2 99%   BMI 24.76 kg/m   Physical Examination:  General: Well developed, well nourished, NAD  HEENT: OP clear, mucus membranes moist  SKIN: warm, dry. No rashes. Neuro: No focal deficits  Musculoskeletal: Muscle strength 5/5 all ext  Psychiatric: Mood and affect normal  Neck: No JVD, no carotid bruits, no thyromegaly, no lymphadenopathy.  Lungs:Clear bilaterally, no wheezes, rhonci, crackles Cardiovascular: Regular rate and rhythm. No murmurs, gallops or rubs. Abdomen:Soft. Bowel sounds present. Non-tender.  Extremities: No lower extremity edema. Pulses are 2 + in the bilateral DP/PT.  Echo 01/20/16: Left ventricle: Wall thickness was increased in a pattern of mild   LVH. Systolic function was mildly to moderately reduced. The   estimated ejection fraction was in the range of 40% to 45%. Mild   diffuse hypokinesis with distinct regional wall motion   abnormalities. Hypokinesis of the basal-midinferior myocardium;   consistent with ischemia in the distribution of the right    coronary artery. - Aortic valve: There was trivial regurgitation. - Left atrium: The atrium was moderately dilated. - Right atrium: The atrium was moderately dilated.  EKG:  EKG is not ordered today. The ekg ordered today demonstrates  Atrial fib, rate 71 bpm.   Recent Labs: 04/01/2016: ALT 15   Lipid Panel    Component Value Date/Time   CHOL 167 04/01/2016 0816   TRIG 199 (H) 04/01/2016 0816   HDL 32 (L) 04/01/2016 0816   CHOLHDL 5.2 (H) 04/01/2016 0816   VLDL 40 (H) 04/01/2016 [id number]  LDLCALC 95 04/01/2016 0816     Wt Readings from Last 3 Encounters:  09/07/16 203 lb 6.4 oz (92.3 kg)  01/08/16 202 lb (91.6 kg)  06/19/15 200 lb (90.7 kg)     Other studies Reviewed: Additional studies/ records that were reviewed today include: . Review of the above records demonstrates:    Assessment and Plan:   1. Atrial fibrillation, persistent: Rate controlled. Continue Lopressor for rate control and coumadin for anticoagulation. He does not wish to try a newer agent.     2. CAD without angina: No chest pain suggestive of angina. Continue ASA, statin, beta blocker.    3. Ischemic cardiomyopathy: LVEF 40-45% by echo Sept 2017. Continue medical therapy.    4. Tobacco abuse, in remission: He no longer smokes. He quit in 2012.   5. HTN: BP is controlled. BP is lower at night. Will lower Lisinopril to 2.5 mg once daily.   6. Hyperlipidemia: Tolerating Crestor. LDL near goal. Did not tolerate high dose statins.   Current medicines are reviewed at length with the patient today.  The patient does not have concerns regarding medicines.  The following changes have been made:  no change  Labs/ tests ordered today include:   Orders Placed This Encounter  Procedures  . EKG 12-Lead    Disposition:   FU with me in 6 months  Signed, Verne Carrow, MD 09/07/2016 9:01 AM    [facility] [address] [place], [place], [place]  [address] Phone: [phone]; Fax:  [phone]

## 2016-10-09 ENCOUNTER — Ambulatory Visit (INDEPENDENT_AMBULATORY_CARE_PROVIDER_SITE_OTHER): Payer: Medicare Other | Admitting: *Deleted

## 2016-10-09 DIAGNOSIS — I4891 Unspecified atrial fibrillation: Secondary | ICD-10-CM | POA: Diagnosis not present

## 2016-10-09 DIAGNOSIS — Z7901 Long term (current) use of anticoagulants: Secondary | ICD-10-CM | POA: Diagnosis not present

## 2016-10-09 DIAGNOSIS — I255 Ischemic cardiomyopathy: Secondary | ICD-10-CM | POA: Diagnosis not present

## 2016-10-09 LAB — POCT INR: INR: 3.4

## 2016-10-10 ENCOUNTER — Other Ambulatory Visit: Payer: Self-pay | Admitting: Cardiovascular Disease

## 2016-12-04 ENCOUNTER — Ambulatory Visit (INDEPENDENT_AMBULATORY_CARE_PROVIDER_SITE_OTHER): Payer: Medicare Other | Admitting: *Deleted

## 2016-12-04 DIAGNOSIS — I4891 Unspecified atrial fibrillation: Secondary | ICD-10-CM | POA: Diagnosis not present

## 2016-12-04 DIAGNOSIS — Z7901 Long term (current) use of anticoagulants: Secondary | ICD-10-CM | POA: Diagnosis not present

## 2016-12-04 DIAGNOSIS — I255 Ischemic cardiomyopathy: Secondary | ICD-10-CM

## 2016-12-04 LAB — POCT INR: INR: 3

## 2016-12-16 ENCOUNTER — Other Ambulatory Visit: Payer: Self-pay | Admitting: Cardiovascular Disease

## 2017-01-08 ENCOUNTER — Other Ambulatory Visit: Payer: Self-pay | Admitting: Cardiovascular Disease

## 2017-01-15 ENCOUNTER — Ambulatory Visit (INDEPENDENT_AMBULATORY_CARE_PROVIDER_SITE_OTHER): Payer: Medicare Other | Admitting: *Deleted

## 2017-01-15 DIAGNOSIS — I4819 Other persistent atrial fibrillation: Secondary | ICD-10-CM

## 2017-01-15 DIAGNOSIS — I4891 Unspecified atrial fibrillation: Secondary | ICD-10-CM

## 2017-01-15 DIAGNOSIS — Z7901 Long term (current) use of anticoagulants: Secondary | ICD-10-CM | POA: Diagnosis not present

## 2017-01-15 DIAGNOSIS — Z5181 Encounter for therapeutic drug level monitoring: Secondary | ICD-10-CM | POA: Diagnosis not present

## 2017-01-15 DIAGNOSIS — I481 Persistent atrial fibrillation: Secondary | ICD-10-CM

## 2017-01-15 LAB — POCT INR: INR: 2.1

## 2017-01-16 ENCOUNTER — Other Ambulatory Visit: Payer: Self-pay | Admitting: Cardiovascular Disease

## 2017-02-08 ENCOUNTER — Other Ambulatory Visit: Payer: Self-pay | Admitting: Cardiovascular Disease

## 2017-02-14 ENCOUNTER — Emergency Department (HOSPITAL_COMMUNITY)
Admission: EM | Admit: 2017-02-14 | Discharge: 2017-02-14 | Disposition: A | Discharge status: Home / Self Care | Payer: Medicare Other | Attending: Emergency Medicine | Admitting: Emergency Medicine

## 2017-02-14 ENCOUNTER — Encounter (HOSPITAL_COMMUNITY): Payer: Self-pay | Admitting: Emergency Medicine

## 2017-02-14 ENCOUNTER — Emergency Department (HOSPITAL_COMMUNITY): Payer: Medicare Other

## 2017-02-14 DIAGNOSIS — Z7901 Long term (current) use of anticoagulants: Secondary | ICD-10-CM | POA: Diagnosis not present

## 2017-02-14 DIAGNOSIS — Z87891 Personal history of nicotine dependence: Secondary | ICD-10-CM | POA: Insufficient documentation

## 2017-02-14 DIAGNOSIS — Y998 Other external cause status: Secondary | ICD-10-CM | POA: Insufficient documentation

## 2017-02-14 DIAGNOSIS — F1010 Alcohol abuse, uncomplicated: Secondary | ICD-10-CM | POA: Diagnosis not present

## 2017-02-14 DIAGNOSIS — Z955 Presence of coronary angioplasty implant and graft: Secondary | ICD-10-CM | POA: Insufficient documentation

## 2017-02-14 DIAGNOSIS — Y929 Unspecified place or not applicable: Secondary | ICD-10-CM | POA: Diagnosis not present

## 2017-02-14 DIAGNOSIS — Z79899 Other long term (current) drug therapy: Secondary | ICD-10-CM | POA: Diagnosis not present

## 2017-02-14 DIAGNOSIS — S9031XA Contusion of right foot, initial encounter: Secondary | ICD-10-CM | POA: Insufficient documentation

## 2017-02-14 DIAGNOSIS — Y9389 Activity, other specified: Secondary | ICD-10-CM | POA: Insufficient documentation

## 2017-02-14 DIAGNOSIS — M7989 Other specified soft tissue disorders: Secondary | ICD-10-CM | POA: Diagnosis not present

## 2017-02-14 DIAGNOSIS — I110 Hypertensive heart disease with heart failure: Secondary | ICD-10-CM | POA: Diagnosis not present

## 2017-02-14 DIAGNOSIS — I2510 Atherosclerotic heart disease of native coronary artery without angina pectoris: Secondary | ICD-10-CM | POA: Insufficient documentation

## 2017-02-14 DIAGNOSIS — I5022 Chronic systolic (congestive) heart failure: Secondary | ICD-10-CM | POA: Insufficient documentation

## 2017-02-14 DIAGNOSIS — M25571 Pain in right ankle and joints of right foot: Secondary | ICD-10-CM | POA: Diagnosis not present

## 2017-02-14 DIAGNOSIS — S99921A Unspecified injury of right foot, initial encounter: Secondary | ICD-10-CM | POA: Diagnosis not present

## 2017-02-14 DIAGNOSIS — L0291 Cutaneous abscess, unspecified: Secondary | ICD-10-CM | POA: Diagnosis not present

## 2017-02-14 DIAGNOSIS — W208XXA Other cause of strike by thrown, projected or falling object, initial encounter: Secondary | ICD-10-CM | POA: Diagnosis not present

## 2017-02-14 DIAGNOSIS — Z7982 Long term (current) use of aspirin: Secondary | ICD-10-CM | POA: Diagnosis not present

## 2017-02-14 DIAGNOSIS — S99911A Unspecified injury of right ankle, initial encounter: Secondary | ICD-10-CM | POA: Diagnosis not present

## 2017-02-14 DIAGNOSIS — T148XXA Other injury of unspecified body region, initial encounter: Secondary | ICD-10-CM

## 2017-02-14 DIAGNOSIS — M79671 Pain in right foot: Secondary | ICD-10-CM | POA: Diagnosis not present

## 2017-02-14 LAB — CBC WITH DIFFERENTIAL/PLATELET
BASOS PCT: 0 %
Basophils Absolute: 0 10*3/uL (ref 0.0–0.1)
EOS ABS: 0.4 10*3/uL (ref 0.0–0.7)
EOS PCT: 5 %
HCT: 43.4 % (ref 39.0–52.0)
Hemoglobin: 14.8 g/dL (ref 13.0–17.0)
LYMPHS ABS: 2.5 10*3/uL (ref 0.7–4.0)
Lymphocytes Relative: 31 %
MCH: 32.5 pg (ref 26.0–34.0)
MCHC: 34.1 g/dL (ref 30.0–36.0)
MCV: 95.2 fL (ref 78.0–100.0)
MONOS PCT: 5 %
Monocytes Absolute: 0.4 10*3/uL (ref 0.1–1.0)
Neutro Abs: 4.7 10*3/uL (ref 1.7–7.7)
Neutrophils Relative %: 59 %
PLATELETS: 263 10*3/uL (ref 150–400)
RBC: 4.56 MIL/uL (ref 4.22–5.81)
RDW: 13.1 % (ref 11.5–15.5)
WBC: 8.1 10*3/uL (ref 4.0–10.5)

## 2017-02-14 LAB — BASIC METABOLIC PANEL
Anion gap: 4 — ABNORMAL LOW (ref 5–15)
BUN: 17 mg/dL (ref 6–20)
CO2: 25 mmol/L (ref 22–32)
Calcium: 8.4 mg/dL — ABNORMAL LOW (ref 8.9–10.3)
Chloride: 104 mmol/L (ref 101–111)
Creatinine, Ser: 1.17 mg/dL (ref 0.61–1.24)
GFR calc Af Amer: >60 mL/min (ref >60)
GFR calc non Af Amer: >60 mL/min (ref >60)
GLUCOSE: 150 mg/dL — AB (ref 65–99)
Potassium: 3.8 mmol/L (ref 3.5–5.1)
Sodium: 133 mmol/L — ABNORMAL LOW (ref 135–145)

## 2017-02-14 LAB — PROTIME-INR
INR: 2.94
PROTHROMBIN TIME: 30.4 s — AB (ref 11.4–15.2)

## 2017-02-14 NOTE — ED Triage Notes (Addendum)
Pt states 8 days ago he was chopping wood, wood fell over onto his right foot/ankle. Pt has significant bruising to right foot on both sides, up into the ankle. Pt was at urgent care where they "lanced an area to inner ankle to drain some of the blood/relieve pressure." The spot that the lanced has a hard knot underneath it. Pt has full feeling to foot, ambulatory.

## 2017-02-14 NOTE — Discharge Instructions (Signed)
You were seen today after injury to the right foot. You had extensive bruising and hematoma. There is no indication for infection at this time. Compression will help the hematoma resorb.  You may ice and elevate as well.

## 2017-02-14 NOTE — ED Provider Notes (Signed)
[facility] EMERGENCY DEPARTMENT Provider Note   CSN: [id number] Arrival date & time: 02/14/17  1416     History   Chief Complaint Chief Complaint  Patient presents with  . Foot Injury  . on blood thinners    HPI Andres Ward is a 66 y.o. male.  HPI  This is a 34 old male with history of atrial fibrillation on Coumadin, coronary artery disease, hypertension, hyperlipidemia who presents with injury to the right foot. Patient reports that one week ago he was splitting wood when a piece of wood fell onto his left foot and ankle. He was wearing tennis shoes. He has been ambulatory. He has noted bruising but no increasing pain. He presented at an outside urgent care with concerns for a continued "lump' on the medial aspect of his ankle. He just wanted to get it checked out. He states that it has not gotten any bigger. He has been icing and elevating his foot. At the urgent care,and attempted to incise the lump was made. He states that he was told that there was no infection and that he needed to follow-up for further evaluation. He reports that his pain is under control and he does not have any acute complaints at this time.  Past Medical History:  Diagnosis Date  . Alcohol abuse   . Atrial fibrillation (HCC)    On coumadin  . CAD (coronary artery disease)    Inf STEMI 11/12: LHC at [facility] 02/27/11: oDx 50%, pCFX 70%, mCFX 80%, OM1 40%, oOM2 40%, dCFX 99% (small - 2mm), pRCA occluded, EF 35%.  He underwent PCI with Promus DES to the pRCA. s/p planned PCI w/ DES to prox Lcx, DES to mid LCx, DES to distal RCA 05/13/11.  Marland Kitchen Chronic lower back pain    "sciatic nerve"  . Chronic systolic heart failure (HCC)   . Hemorrhoids   . HLD (hyperlipidemia)   . HTN (hypertension)   . Ischemic cardiomyopathy    Echo 02/27/11: EF 15%, mod LVE, mild to mod MR, mod BAE, mod RVE, mod decreased RVSF, PASP 48, mild to mod pulmo HTN.  . Tobacco abuse     Patient Active Problem List   Diagnosis Date Noted  . Leg pain, right 12/03/2011  . HTN (hypertension) 08/20/2011  . Claudication (HCC) 05/06/2011  . ST elevation myocardial infarction (STEMI) of inferior wall (HCC) 03/05/2011  . Tobacco abuse 03/05/2011  . Alcohol abuse 03/05/2011  . CAD (coronary artery disease) 03/01/2011  . Ischemic cardiomyopathy 03/01/2011  . Atrial fibrillation (HCC) 03/01/2011  . Alcohol withdrawal (HCC) 03/01/2011    Past Surgical History:  Procedure Laterality Date  . BACK SURGERY  ~ 1987   L4-5 ruptured disc  . CARDIOVERSION  07/02/2011   Procedure: CARDIOVERSION;  Surgeon: Kathleene Hazel, MD;  Location: [facility] OR;  Service: Cardiovascular;  Laterality: N/A;  . CORONARY ANGIOPLASTY WITH STENT PLACEMENT  02/27/11   "1"  . CORONARY ANGIOPLASTY WITH STENT PLACEMENT  05/13/11   "3"  . PERCUTANEOUS CORONARY STENT INTERVENTION (PCI-S) N/A 05/13/2011   Procedure: PERCUTANEOUS CORONARY STENT INTERVENTION (PCI-S);  Surgeon: Kathleene Hazel, MD;  Location: [facility] CATH LAB;  Service: Cardiovascular;  Laterality: N/A;       Home Medications    Prior to Admission medications   Medication Sig Start Date End Date Taking? Authorizing Provider  acetaminophen (TYLENOL) 500 MG tablet Take 500 mg by mouth every 6 (six) hours as needed for pain.    [provider]  aspirin EC 81 MG tablet Take 1 tablet (81 mg total) by mouth daily. 04/13/12   Kathleene HazelMcAlhany, Christopher D, MD  lisinopril (PRINIVIL,ZESTRIL) 2.5 MG tablet Take 1 tablet (2.5 mg total) by mouth daily. 09/07/16 12/06/16  Kathleene HazelMcAlhany, Christopher D, MD  metoprolol tartrate (LOPRESSOR) 100 MG tablet TAKE ONE TABLET BY MOUTH TWICE DAILY 12/16/16   Kathleene HazelMcAlhany, Christopher D, MD  NITROSTAT 0.4 MG SL tablet PLACE 1 TABLET UNDER THE TONGUE EVERY 5 MINUTES AS NEEDED FOR CHEST PAIN (MAX IS3 TABLETS) 10/12/16   Kathleene HazelMcAlhany, Christopher D, MD  rosuvastatin (CRESTOR) 10 MG tablet TAKE ONE TABLET BY MOUTH EVERY DAY 01/18/17   Kathleene HazelMcAlhany, Christopher D, MD    spironolactone (ALDACTONE) 25 MG tablet TAKE ONE TABLET BY MOUTH EVERY DAY 02/10/17   Kathleene HazelMcAlhany, Christopher D, MD  warfarin (COUMADIN) 5 MG tablet TAKE AS DIRECTED BY COUMADIN CLINIC 01/08/17   Kathleene HazelMcAlhany, Christopher D, MD    Family History Family History  Problem Relation Age of Onset  . Hypertension Mother   . Heart attack Mother 381  . Stroke Father        5 STROKES AND MI IN HIS 80'S  . Heart attack Father 5580    Social History Social History  Substance Use Topics  . Smoking status: Former Smoker    Packs/day: 1.00    Years: 44.00    Types: Cigarettes    Quit date: 02/27/2011  . Smokeless tobacco: Former NeurosurgeonUser    Types: Chew    Quit date: 02/27/2011  . Alcohol use Yes     Comment: "stopped drinking alcohol 02/27/11; had been drinking 1 mixed drink qd 365 days/yr""     Allergies   Lipitor [atorvastatin]   Review of Systems Review of Systems  Musculoskeletal:       Right foot pain  Skin: Positive for color change and wound.  All other systems reviewed and are negative.    Physical Exam Updated Vital Signs BP (!) 105/92   Pulse 89   Temp 98.3 F (36.8 C)   Resp 18   Ht 6' (1.829 m)   Wt 89.9 kg (198 lb 4 oz)   SpO2 100%   BMI 26.89 kg/m   Physical Exam  Constitutional: He is oriented to person, place, and time. He appears well-developed and well-nourished. No distress.  HENT:  Head: Normocephalic and atraumatic.  Cardiovascular: Normal rate and normal heart sounds.   No murmur heard. Irregular rhythm  Pulmonary/Chest: Effort normal. No respiratory distress. He has wheezes.  Musculoskeletal: He exhibits no edema.  extensive bruising noted over the medial aspect of the right ankle and foot, appears subacute, there is a 2 x 3 cm boggy hematoma over the medial aspect of the right ankle with a 1.5 cm incision. No active bleeding. No warmth or erythema.  2+ DP pulse, neurovascularly intact.  Neurological: He is alert and oriented to person, place, and time.   Skin: Skin is warm and dry.  Psychiatric: He has a normal mood and affect.  Nursing note and vitals reviewed.    ED Treatments / Results  Labs (all labs ordered are listed, but only abnormal results are displayed) Labs Reviewed  PROTIME-INR - Abnormal; Notable for the following:       Result Value   Prothrombin Time 30.4 (*)    All other components within normal limits  BASIC METABOLIC PANEL - Abnormal; Notable for the following:    Sodium 133 (*)    Glucose, Bld 150 (*)    Calcium  8.4 (*)    Anion gap 4 (*)    All other components within normal limits  CBC WITH DIFFERENTIAL/PLATELET    EKG  EKG Interpretation None       Radiology Dg Ankle Complete Right  Result Date: 02/14/2017 CLINICAL DATA:  Pain after trauma EXAM: RIGHT ANKLE - COMPLETE 3+ VIEW COMPARISON:  None. FINDINGS: Vascular calcifications. Soft tissue swelling over the distal tibia. No fractures. IMPRESSION: Soft tissue swelling and probable hematoma at the site of patient's symptoms. No fractures. Electronically Signed   By: Gerome Sam III M.D   On: 02/14/2017 15:15   Dg Foot Complete Right  Result Date: 02/14/2017 CLINICAL DATA:  Pain after trauma EXAM: RIGHT FOOT COMPLETE - 3+ VIEW COMPARISON:  None. FINDINGS: Degenerative changes at the first MTP joint. Vascular calcifications are noted. No fractures are seen. IMPRESSION: No acute abnormalities. Electronically Signed   By: Gerome Sam III M.D   On: 02/14/2017 15:14    Procedures Procedures (including critical care time)  Medications Ordered in ED Medications - No data to display   Initial Impression / Assessment and Plan / ED Course  I have reviewed the triage vital signs and the nursing notes.  Pertinent labs & imaging results that were available during my care of the patient were reviewed by me and considered in my medical decision making (see chart for details).     Patient presents with an injury to the right foot. He is on  Coumadin. An attempt to incise a hematoma at an outside urgent care was done prior to arrival. X-rays are negative for acute fracture. Physical exam appears consistent with a hematoma. Patient states that it has not increased in size since injury. Would not do any further exploration. I discussed with the patient that this will likely resolve on its own with compression and ice. However, given incision, he is now at an increased risk for infection and needs to monitor closely.Patient stated understanding.  After history, exam, and medical workup I feel the patient has been appropriately medically screened and is safe for discharge home. Pertinent diagnoses were discussed with the patient. Patient was given return precautions.   Final Clinical Impressions(s) / ED Diagnoses   Final diagnoses:  Contusion of right foot, initial encounter  Hematoma    New Prescriptions New Prescriptions   No medications on file     Shon Baton, MD 02/14/17 1628

## 2017-02-14 NOTE — ED Notes (Signed)
ED Provider at bedside. 

## 2017-02-26 ENCOUNTER — Ambulatory Visit (INDEPENDENT_AMBULATORY_CARE_PROVIDER_SITE_OTHER): Payer: Medicare Other | Admitting: Pharmacist

## 2017-02-26 DIAGNOSIS — I4891 Unspecified atrial fibrillation: Secondary | ICD-10-CM

## 2017-02-26 DIAGNOSIS — Z7901 Long term (current) use of anticoagulants: Secondary | ICD-10-CM | POA: Diagnosis not present

## 2017-02-26 LAB — POCT INR: INR: 2.1

## 2017-04-09 ENCOUNTER — Ambulatory Visit (INDEPENDENT_AMBULATORY_CARE_PROVIDER_SITE_OTHER): Payer: Medicare Other | Admitting: *Deleted

## 2017-04-09 DIAGNOSIS — I4891 Unspecified atrial fibrillation: Secondary | ICD-10-CM

## 2017-04-09 DIAGNOSIS — Z5181 Encounter for therapeutic drug level monitoring: Secondary | ICD-10-CM

## 2017-04-09 LAB — POCT INR: INR: 1.9

## 2017-04-09 NOTE — Patient Instructions (Signed)
Description   Today Dec 14th take 1 and 1/2 tablets (7.5mg ) then continue on same dosage 1 tablet everyday except 1/2 tablet on Wednesdays and Saturdays. Recheck in 6 weeks.

## 2017-04-21 DIAGNOSIS — J00 Acute nasopharyngitis [common cold]: Secondary | ICD-10-CM | POA: Diagnosis not present

## 2017-04-21 DIAGNOSIS — J209 Acute bronchitis, unspecified: Secondary | ICD-10-CM | POA: Diagnosis not present

## 2017-04-22 ENCOUNTER — Telehealth: Payer: Self-pay | Admitting: Cardiovascular Disease

## 2017-04-22 NOTE — Telephone Encounter (Signed)
Pt is calling requesting a refill on Viagra, medication is not on medication list. Would you like to refill this medication? Please advise

## 2017-04-22 NOTE — Telephone Encounter (Signed)
New Message   *STAT* If patient is at the pharmacy, call can be transferred to refill team.   1. Which medications need to be refilled? (please list name of each medication and dose if known) generic for Viagra 25mg    2. Which pharmacy/location (including street and city if local pharmacy) is medication to be sent to? [place] [facility]   3. Do they need a 30 day or 90 day supply? 90

## 2017-04-23 MED ORDER — SILDENAFIL CITRATE 50 MG PO TABS: 50.0000 mg | ORAL_TABLET | Freq: Every day | ORAL | 6 refills | Status: DC | PRN

## 2017-04-23 NOTE — Telephone Encounter (Signed)
F/U:  Patient calling for update.

## 2017-04-23 NOTE — Telephone Encounter (Signed)
Thanks

## 2017-04-23 NOTE — Telephone Encounter (Signed)
Informed patient that per Dr.McAlhany okay to start sildenafil 50 mg as needed. Patient stated that he took 25 mg in the past. Informed patient that 50 mg was sent to his pharmacy in [place]. I also educated the patient on how to take nitro SL while taking Viagra. Informed patient that he should withhold taking any nitro within 48 hours of taking sildenafil. If he  experiences any chest pain he should call EMS. Patient verbalized understanding and thanked me for the call.

## 2017-04-23 NOTE — Telephone Encounter (Signed)
I am ok with filling Viagra for him. He is not on a long acting nitrate. Can we check with him and see what he has been taking? We can give him 50 mg or 100 mg pills to use as needed. I would give him 5 pills with 6 refills. Thanks, chris

## 2017-04-23 NOTE — Telephone Encounter (Signed)
Patient is requesting a refill on Viagra. I do not see on med list. Informed patient I would forward to Dr. Clifton JamesMcAlhany for review and we will give him a call back. Patient verbalized understanding and thanked me for the call.

## 2017-05-21 ENCOUNTER — Ambulatory Visit (INDEPENDENT_AMBULATORY_CARE_PROVIDER_SITE_OTHER): Payer: Medicare Other | Admitting: *Deleted

## 2017-05-21 DIAGNOSIS — I4891 Unspecified atrial fibrillation: Secondary | ICD-10-CM

## 2017-05-21 DIAGNOSIS — Z5181 Encounter for therapeutic drug level monitoring: Secondary | ICD-10-CM

## 2017-05-21 LAB — POCT INR: INR: 2

## 2017-05-21 NOTE — Patient Instructions (Signed)
Description   Today take 1.5 tablets then continue on same dosage 1 tablet everyday except 1/2 tablet on Wednesdays and Saturdays. Recheck in 6 weeks.

## 2017-06-07 ENCOUNTER — Encounter: Payer: Self-pay | Admitting: Cardiovascular Disease

## 2017-06-07 ENCOUNTER — Ambulatory Visit (INDEPENDENT_AMBULATORY_CARE_PROVIDER_SITE_OTHER): Payer: Medicare Other | Admitting: Cardiovascular Disease

## 2017-06-07 VITALS — BP 122/80 | HR 76 | Ht 72.0 in | Wt 202.8 lb

## 2017-06-07 DIAGNOSIS — I255 Ischemic cardiomyopathy: Secondary | ICD-10-CM | POA: Diagnosis not present

## 2017-06-07 DIAGNOSIS — I4819 Other persistent atrial fibrillation: Secondary | ICD-10-CM

## 2017-06-07 DIAGNOSIS — I2510 Atherosclerotic heart disease of native coronary artery without angina pectoris: Secondary | ICD-10-CM | POA: Diagnosis not present

## 2017-06-07 DIAGNOSIS — I10 Essential (primary) hypertension: Secondary | ICD-10-CM | POA: Diagnosis not present

## 2017-06-07 DIAGNOSIS — I481 Persistent atrial fibrillation: Secondary | ICD-10-CM

## 2017-06-07 DIAGNOSIS — E7800 Pure hypercholesterolemia, unspecified: Secondary | ICD-10-CM | POA: Diagnosis not present

## 2017-06-07 LAB — LIPID PANEL
CHOLESTEROL TOTAL: 164 mg/dL (ref 100–199)
Chol/HDL Ratio: 5.3 ratio — ABNORMAL HIGH (ref 0.0–5.0)
HDL: 31 mg/dL — AB (ref >39)
LDL Calculated: 72 mg/dL (ref 0–99)
Triglycerides: 305 mg/dL — ABNORMAL HIGH (ref 0–149)
VLDL CHOLESTEROL CAL: 61 mg/dL — AB (ref 5–40)

## 2017-06-07 LAB — HEPATIC FUNCTION PANEL
ALK PHOS: 75 IU/L (ref 39–117)
ALT: 20 IU/L (ref 0–44)
AST: 23 IU/L (ref 0–40)
Albumin: 4.2 g/dL (ref 3.6–4.8)
BILIRUBIN, DIRECT: 0.12 mg/dL (ref 0.00–0.40)
Bilirubin Total: 0.4 mg/dL (ref 0.0–1.2)
TOTAL PROTEIN: 6.8 g/dL (ref 6.0–8.5)

## 2017-06-07 MED ORDER — METOPROLOL TARTRATE 50 MG PO TABS: 50.0000 mg | ORAL_TABLET | Freq: Two times a day (BID) | ORAL | 3 refills | Status: DC

## 2017-06-07 NOTE — Progress Notes (Signed)
Chief Complaint  Patient presents with  . Coronary Artery Disease     History of Present Illness: 67 yo male with history of CAD, persistent atrial fibrillation, cardiomyopathy here today for cardiac follow up. He had an inferior STEMI in  November 2012 with cardiomyopathy felt to be out of proportion to his CAD. At that time he reported heavy alcohol and tobacco abuse. He was in atrial fib at the time of admission. Cardiac cath with occluded RCA treated with a drug eluting stent. He was also found to have 50% ostial diagonal stenosis, 70% proximal Circumflex stenosis, 80% mid Circumflex stenosis, 40% stenoses in both obtuse marginal branches. LVEF as low as 15% in 2012. He has staged PCI with placement of drug eluting stents in the proximal and mid Circumflex and distal RCA in January 2013. He stopped drinking alcohol and tobacco. LVEF improved to 55% by echo April 2013. He underwent cardioversion in 2013 but converted back to atrial fib later that year. He has remained in atrial fib since then on coumadin. Normal ABI 2013. Echo September 2017 with LVEF 40-45%, no significant valve disease.   He is here today for follow up. The patient denies any chest pain, dyspnea, palpitations, lower extremity edema, orthopnea, PND, dizziness, near syncope or syncope.   Primary Care Physician: Patient, No Pcp Per  Past Medical History:  Diagnosis Date  . Alcohol abuse   . Atrial fibrillation (HCC)    On coumadin  . CAD (coronary artery disease)    Inf STEMI 11/12: LHC at [facility] 02/27/11: oDx 50%, pCFX 70%, mCFX 80%, OM1 40%, oOM2 40%, dCFX 99% (small - 2mm), pRCA occluded, EF 35%.  He underwent PCI with Promus DES to the pRCA. s/p planned PCI w/ DES to prox Lcx, DES to mid LCx, DES to distal RCA 05/13/11.  Marland Kitchen Chronic lower back pain    "sciatic nerve"  . Chronic systolic heart failure (HCC)   . Hemorrhoids   . HLD (hyperlipidemia)   . HTN (hypertension)   . Ischemic cardiomyopathy    Echo 02/27/11: EF 15%,  mod LVE, mild to mod MR, mod BAE, mod RVE, mod decreased RVSF, PASP 48, mild to mod pulmo HTN.  . Tobacco abuse     Past Surgical History:  Procedure Laterality Date  . BACK SURGERY  ~ 1987   L4-5 ruptured disc  . CARDIOVERSION  07/02/2011   Procedure: CARDIOVERSION;  Surgeon: Kathleene Hazel, MD;  Location: [facility] OR;  Service: Cardiovascular;  Laterality: N/A;  . CORONARY ANGIOPLASTY WITH STENT PLACEMENT  02/27/11   "1"  . CORONARY ANGIOPLASTY WITH STENT PLACEMENT  05/13/11   "3"  . PERCUTANEOUS CORONARY STENT INTERVENTION (PCI-S) N/A 05/13/2011   Procedure: PERCUTANEOUS CORONARY STENT INTERVENTION (PCI-S);  Surgeon: Kathleene Hazel, MD;  Location: [facility] CATH LAB;  Service: Cardiovascular;  Laterality: N/A;    Current Outpatient Medications  Medication Sig Dispense Refill  . acetaminophen (TYLENOL) 500 MG tablet Take 500 mg by mouth every 6 (six) hours as needed for pain.    Marland Kitchen aspirin EC 81 MG tablet Take 1 tablet (81 mg total) by mouth daily. 30 tablet 3  . NITROSTAT 0.4 MG SL tablet PLACE 1 TABLET UNDER THE TONGUE EVERY 5 MINUTES AS NEEDED FOR CHEST PAIN (MAX IS3 TABLETS) 25 tablet 3  . rosuvastatin (CRESTOR) 10 MG tablet TAKE ONE TABLET BY MOUTH EVERY DAY 30 tablet 7  . sildenafil (VIAGRA) 50 MG tablet Take 1 tablet (50 mg total) by mouth daily  as needed for erectile dysfunction. 5 tablet 6  . spironolactone (ALDACTONE) 25 MG tablet TAKE ONE TABLET BY MOUTH EVERY DAY 90 tablet 1  . warfarin (COUMADIN) 5 MG tablet TAKE AS DIRECTED BY COUMADIN CLINIC 35 tablet 3  . lisinopril (PRINIVIL,ZESTRIL) 2.5 MG tablet Take 1 tablet (2.5 mg total) by mouth daily. 30 tablet 11  . metoprolol tartrate (LOPRESSOR) 50 MG tablet Take 1 tablet (50 mg total) by mouth 2 (two) times daily. 180 tablet 3   No current facility-administered medications for this visit.     Allergies  Allergen Reactions  . Lipitor [Atorvastatin] Other (See Comments)    Numbness, aching, unable to sleep    Social  History   Socioeconomic History  . Marital status: Single    Spouse name: Not on file  . Number of children: Not on file  . Years of education: Not on file  . Highest education level: Not on file  Social Needs  . Financial resource strain: Not on file  . Food insecurity - worry: Not on file  . Food insecurity - inability: Not on file  . Transportation needs - medical: Not on file  . Transportation needs - non-medical: Not on file  Occupational History  . Not on file  Tobacco Use  . Smoking status: Former Smoker    Packs/day: 1.00    Years: 44.00    Pack years: 44.00    Types: Cigarettes    Last attempt to quit: 02/27/2011    Years since quitting: 6.2  . Smokeless tobacco: Former Neurosurgeon    Types: Chew    Quit date: 02/27/2011  Substance and Sexual Activity  . Alcohol use: Yes    Comment: "stopped drinking alcohol 02/27/11; had been drinking 1 mixed drink qd 365 days/yr""  . Drug use: Yes    Types: Marijuana    Comment: "used pot ages [age]"  . Sexual activity: Yes  Other Topics Concern  . Not on file  Social History Narrative  . Not on file    Family History  Problem Relation Age of Onset  . Hypertension Mother   . Heart attack Mother 60  . Stroke Father        5 STROKES AND MI IN HIS 80'S  . Heart attack Father 58    Review of Systems:  As stated in the HPI and otherwise negative.   BP 122/80   Pulse 76   Ht 6' (1.829 m)   Wt 202 lb 12.8 oz (92 kg)   SpO2 96%   BMI 27.50 kg/m   Physical Examination:  General: Well developed, well nourished, NAD  HEENT: OP clear, mucus membranes moist  SKIN: warm, dry. No rashes. Neuro: No focal deficits  Musculoskeletal: Muscle strength 5/5 all ext  Psychiatric: Mood and affect normal  Neck: No JVD, no carotid bruits, no thyromegaly, no lymphadenopathy.  Lungs:Clear bilaterally, no wheezes, rhonci, crackles Cardiovascular: Irreg irreg. No murmurs, gallops or rubs. Abdomen:Soft. Bowel sounds present. Non-tender.    Extremities: No lower extremity edema. Pulses are 2 + in the bilateral DP/PT.  Echo 01/20/16: Left ventricle: Wall thickness was increased in a pattern of mild   LVH. Systolic function was mildly to moderately reduced. The   estimated ejection fraction was in the range of 40% to 45%. Mild   diffuse hypokinesis with distinct regional wall motion   abnormalities. Hypokinesis of the basal-midinferior myocardium;   consistent with ischemia in the distribution of the right   coronary artery. -  Aortic valve: There was trivial regurgitation. - Left atrium: The atrium was moderately dilated. - Right atrium: The atrium was moderately dilated.  EKG:  EKG is  ordered today. The ekg ordered today demonstrates  Atrial fib, rate 76 bpm.   Recent Labs: 02/14/2017: BUN 17; Creatinine, Ser 1.17; Hemoglobin 14.8; Platelets 263; Potassium 3.8; Sodium 133   Lipid Panel    Component Value Date/Time   CHOL 167 04/01/2016 0816   TRIG 199 (H) 04/01/2016 0816   HDL 32 (L) 04/01/2016 0816   CHOLHDL 5.2 (H) 04/01/2016 0816   VLDL 40 (H) 04/01/2016 0816   LDLCALC 95 04/01/2016 0816     Wt Readings from Last 3 Encounters:  06/07/17 202 lb 12.8 oz (92 kg)  02/14/17 198 lb 4 oz (89.9 kg)  09/07/16 203 lb 6.4 oz (92.3 kg)     Other studies Reviewed: Additional studies/ records that were reviewed today include: . Review of the above records demonstrates:    Assessment and Plan:   1. Atrial fibrillation, persistent: Heart rate is well controlled. Will continue Lopressor and coumadin. He has refused to consider a DOAC.  Some hypotension at night. Will reduce Lopressor to 50 mg po BID   2. CAD without angina: He has no chest pain. Will continue ASA, statin and beta blocker.     3. Ischemic cardiomyopathy: He is NYHA class 1. LVEF 40-45% by echo Sept 2017. Will continue beta blocker and Ace-inh.   4. Tobacco abuse, in remission: He no longer smokes. He quit in 2012.   5. HTN: BP is well controlled.  No changes.    6. Hyperlipidemia He is tolerating Crestor at a low dose. He has not tolerated higher doses of statins. Will repeat lipids today.   Current medicines are reviewed at length with the patient today.  The patient does not have concerns regarding medicines.  The following changes have been made:  no change  Labs/ tests ordered today include:   Orders Placed This Encounter  Procedures  . Lipid Profile  . Hepatic function panel  . EKG 12-Lead    Disposition:   FU with me in 6 months  Signed, Verne Carrowhristopher Ayza Ripoll, MD 06/07/2017 9:36 AM    [facility] [address], [place], [place]  [address] Phone: [phone]; Fax: [phone]

## 2017-06-07 NOTE — Patient Instructions (Signed)
Medication Instructions:   Your physician has recommended you make the following change in your medication:  Decrease lopressor to 50 mg by mouth twice daily.      Labwork: Lab work to be done today--Lipid and liver profiles  Testing/Procedures:  none  Follow-Up: Your physician recommends that you schedule a follow-up appointment in: 12 months. Please call our office in about 9 months to schedule this appointment    Any Other Special Instructions Will Be Listed Below (If Applicable).     If you need a refill on your cardiac medications before your next appointment, please call your pharmacy.

## 2017-06-09 ENCOUNTER — Telehealth: Payer: Self-pay | Admitting: Cardiovascular Disease

## 2017-06-09 NOTE — Telephone Encounter (Signed)
I spoke with pt and reviewed lipid and liver results with him.  

## 2017-06-09 NOTE — Telephone Encounter (Signed)
New Message ° ° ° °Patient is returning call in reference to labs. Please call.  °

## 2017-07-02 ENCOUNTER — Ambulatory Visit (INDEPENDENT_AMBULATORY_CARE_PROVIDER_SITE_OTHER): Payer: Medicare Other | Admitting: Pharmacist

## 2017-07-02 DIAGNOSIS — I4891 Unspecified atrial fibrillation: Secondary | ICD-10-CM | POA: Diagnosis not present

## 2017-07-02 LAB — POCT INR: INR: 2.5

## 2017-07-02 NOTE — Patient Instructions (Signed)
Description   Continue on same dosage 1 tablet everyday except 1/2 tablet on Wednesdays and Saturdays. Recheck in 6 weeks.

## 2017-08-13 ENCOUNTER — Ambulatory Visit (INDEPENDENT_AMBULATORY_CARE_PROVIDER_SITE_OTHER): Payer: Medicare Other | Admitting: Pharmacist

## 2017-08-13 DIAGNOSIS — I4891 Unspecified atrial fibrillation: Secondary | ICD-10-CM

## 2017-08-13 LAB — POCT INR: INR: 4.9

## 2017-08-13 NOTE — Patient Instructions (Signed)
Description   No warfarin today or tomorrow then Continue on same dosage 1 tablet everyday except 1/2 tablet on Wednesdays and Saturdays. Recheck in 3 weeks.

## 2017-08-17 ENCOUNTER — Other Ambulatory Visit: Payer: Self-pay | Admitting: Cardiovascular Disease

## 2017-08-24 ENCOUNTER — Other Ambulatory Visit: Payer: Self-pay | Admitting: Cardiovascular Disease

## 2017-09-25 ENCOUNTER — Other Ambulatory Visit: Payer: Self-pay | Admitting: Cardiovascular Disease

## 2017-09-27 ENCOUNTER — Ambulatory Visit (INDEPENDENT_AMBULATORY_CARE_PROVIDER_SITE_OTHER): Payer: Medicare Other | Admitting: *Deleted

## 2017-09-27 DIAGNOSIS — I4891 Unspecified atrial fibrillation: Secondary | ICD-10-CM | POA: Diagnosis not present

## 2017-09-27 DIAGNOSIS — Z5181 Encounter for therapeutic drug level monitoring: Secondary | ICD-10-CM

## 2017-09-27 LAB — POCT INR: INR: 4.5 — AB (ref 2.0–3.0)

## 2017-09-27 NOTE — Patient Instructions (Addendum)
Description   No warfarin today or tomorrow then start taking 1 tablet everyday except 1/2 tablet on Mondays, Wednesdays, and Saturdays. Recheck in 3 weeks per pt request.

## 2017-10-02 ENCOUNTER — Other Ambulatory Visit: Payer: Self-pay | Admitting: Cardiovascular Disease

## 2017-10-19 ENCOUNTER — Ambulatory Visit (INDEPENDENT_AMBULATORY_CARE_PROVIDER_SITE_OTHER): Payer: Medicare Other | Admitting: Pharmacist

## 2017-10-19 DIAGNOSIS — I4891 Unspecified atrial fibrillation: Secondary | ICD-10-CM

## 2017-10-19 LAB — POCT INR: INR: 1.3 — AB (ref 2.0–3.0)

## 2017-10-19 NOTE — Patient Instructions (Signed)
Description   Take an extra 1/2 tablet for the next 3 days (1.5 tabs today, 1 tab tomorrow, 1.5 tabs Thursday), then continue taking 1 tablet everyday except 1/2 tablet on Mondays, Wednesdays, and Saturdays. Go back to eating your normal amount of greens and stay consistent. Recheck in 1 week.

## 2017-10-22 ENCOUNTER — Other Ambulatory Visit: Payer: Self-pay | Admitting: Cardiovascular Disease

## 2017-10-25 ENCOUNTER — Ambulatory Visit (INDEPENDENT_AMBULATORY_CARE_PROVIDER_SITE_OTHER): Payer: Medicare Other | Admitting: *Deleted

## 2017-10-25 DIAGNOSIS — Z5181 Encounter for therapeutic drug level monitoring: Secondary | ICD-10-CM

## 2017-10-25 DIAGNOSIS — I4891 Unspecified atrial fibrillation: Secondary | ICD-10-CM | POA: Diagnosis not present

## 2017-10-25 LAB — POCT INR: INR: 3.1 — AB (ref 2.0–3.0)

## 2017-10-25 NOTE — Patient Instructions (Signed)
Description   Do good serving of greens today then  continue taking 1 tablet everyday except 1/2 tablet on Mondays, Wednesdays, and Saturdays. Go back to eating your normal amount of greens and stay consistent. Recheck in 2 weeks

## 2017-11-08 ENCOUNTER — Ambulatory Visit (INDEPENDENT_AMBULATORY_CARE_PROVIDER_SITE_OTHER): Payer: Medicare Other | Admitting: *Deleted

## 2017-11-08 DIAGNOSIS — Z5181 Encounter for therapeutic drug level monitoring: Secondary | ICD-10-CM

## 2017-11-08 DIAGNOSIS — I4891 Unspecified atrial fibrillation: Secondary | ICD-10-CM | POA: Diagnosis not present

## 2017-11-08 LAB — POCT INR: INR: 2.8 (ref 2.0–3.0)

## 2017-11-08 NOTE — Patient Instructions (Addendum)
Description   Ccontinue taking 1 tablet everyday except 1/2 tablet on Mondays, Wednesdays, and Saturdays. Continue eating your normal amount of greens. Recheck in 4 weeks.

## 2017-12-10 ENCOUNTER — Ambulatory Visit (INDEPENDENT_AMBULATORY_CARE_PROVIDER_SITE_OTHER): Payer: Medicare Other | Admitting: *Deleted

## 2017-12-10 DIAGNOSIS — I4891 Unspecified atrial fibrillation: Secondary | ICD-10-CM | POA: Diagnosis not present

## 2017-12-10 LAB — POCT INR: INR: 4 — AB (ref 2.0–3.0)

## 2017-12-10 NOTE — Patient Instructions (Signed)
Description   Skip today's dose, then continue taking 1 tablet everyday except 1/2 tablet on Mondays, Wednesdays, and Saturdays. Continue eating your normal amount of greens. Recheck in 2 weeks.

## 2017-12-24 ENCOUNTER — Ambulatory Visit (INDEPENDENT_AMBULATORY_CARE_PROVIDER_SITE_OTHER): Payer: Medicare Other | Admitting: *Deleted

## 2017-12-24 DIAGNOSIS — I4891 Unspecified atrial fibrillation: Secondary | ICD-10-CM | POA: Diagnosis not present

## 2017-12-24 DIAGNOSIS — Z5181 Encounter for therapeutic drug level monitoring: Secondary | ICD-10-CM

## 2017-12-24 LAB — POCT INR: INR: 3.8 — AB (ref 2.0–3.0)

## 2017-12-24 NOTE — Patient Instructions (Signed)
Description   Skip today's dose, then start taking 1/2 tablet daily except 1 tablets Sundays, Tuesday, and Thursdays.  Continue eating your normal amount of greens. Recheck in 2 weeks.

## 2018-01-07 ENCOUNTER — Ambulatory Visit (INDEPENDENT_AMBULATORY_CARE_PROVIDER_SITE_OTHER): Payer: Medicare Other

## 2018-01-07 DIAGNOSIS — I4891 Unspecified atrial fibrillation: Secondary | ICD-10-CM | POA: Diagnosis not present

## 2018-01-07 LAB — POCT INR: INR: 1.8 — AB (ref 2.0–3.0)

## 2018-01-07 NOTE — Patient Instructions (Signed)
Description   Take 1 tablet today, then resume same dosage 1/2 tablet daily except 1 tablets Sundays, Tuesdays, and Thursdays.  Continue eating your normal amount of greens. Recheck in 2 weeks.

## 2018-01-20 ENCOUNTER — Ambulatory Visit (INDEPENDENT_AMBULATORY_CARE_PROVIDER_SITE_OTHER): Payer: Medicare Other | Admitting: *Deleted

## 2018-01-20 DIAGNOSIS — I4891 Unspecified atrial fibrillation: Secondary | ICD-10-CM | POA: Diagnosis not present

## 2018-01-20 LAB — POCT INR: INR: 4.4 — AB (ref 2.0–3.0)

## 2018-01-20 NOTE — Patient Instructions (Signed)
Description   Skip today's dose, then change your dose to 1/2  tablet daily except 1 tablet on Sundays and Thursdays.   Continue eating your normal amount of greens of 3 servings each week. Recheck in 3 weeks.

## 2018-02-11 ENCOUNTER — Ambulatory Visit (INDEPENDENT_AMBULATORY_CARE_PROVIDER_SITE_OTHER): Payer: Medicare Other | Admitting: Pharmacist

## 2018-02-11 DIAGNOSIS — I4891 Unspecified atrial fibrillation: Secondary | ICD-10-CM | POA: Diagnosis not present

## 2018-02-11 LAB — POCT INR: INR: 1.8 — AB (ref 2.0–3.0)

## 2018-02-11 NOTE — Patient Instructions (Signed)
Description   Start taking 1/2  tablet daily except 1 tablet on Mondays, Wednesdays, and Fridays. Continue eating your normal amount of greens of 3 servings each week. Recheck in 3 weeks.

## 2018-02-14 ENCOUNTER — Telehealth: Payer: Self-pay | Admitting: Cardiovascular Disease

## 2018-02-14 NOTE — Telephone Encounter (Signed)
New message   Pt c/o medication issue:  1. Name of Medication:warfarin (COUMADIN) 5 MG tablet  2. How are you currently taking this medication (dosage and times per day)? 1 time daily  3. Are you having a reaction (difficulty breathing--STAT)? no  4. What is your medication issue? Patient states that he wants to switch to Plavix because when his blood when it is checked is either too thick or thin. Patient states that it is a 45 minute drive to have coumadin checked.

## 2018-02-15 NOTE — Telephone Encounter (Signed)
Follow up:   Patient following up on a ohone call he made on yesterday. please call back.

## 2018-02-15 NOTE — Telephone Encounter (Signed)
Pt called to report that he discussed with Dr. Clifton James at his last OV in 2/19 about switching his Coumadin to Plavix but at the time he declined but now he would like to switch away from Coumadin because it will cut down on his travel time to keep coming for INR checks and he says he is frustrated with trying to keep it in range. I advised him that I will forward to Dr. Clifton James and will let him know his recommendations.

## 2018-02-15 NOTE — Telephone Encounter (Signed)
Pat, I had spoken to him about changing his coumadin to Eliquis or Xarelto. I did not talk about a switch to Plavix as he is on coumadin for his persistent atrial fibrillation and Plavix will not provide anti-coagulation. Can you let him know that we can change him to Eliquis or Xarelto if he wishes. He can check to see what his cost would be for these medications. Thayer Ohm

## 2018-02-16 NOTE — Telephone Encounter (Signed)
I spoke with pt and gave him information from Dr. Clifton James. He reports he has checked and Xarelto will cost $400 per month. He cannot afford this.  Has not checked on cost of Eliquis. Pt has medicare but does not have part D. I told him I would check with our patient assistance team to see if he may qualify for medication assistance.

## 2018-02-17 NOTE — Telephone Encounter (Signed)
I spoke with patient and we decided for him to apply for patient assistance for  XARELTO with [employer]. I will mail him application and place provider part in Dr Clifton James mailbox.

## 2018-02-21 NOTE — Telephone Encounter (Signed)
Dr Clifton James has signed the [place] and [place] pt asst application and I have placed it in the yellow folder in the PA Dept awaiting the pt to return his part.

## 2018-02-25 ENCOUNTER — Telehealth: Payer: Self-pay | Admitting: Cardiovascular Disease

## 2018-02-25 MED ORDER — WARFARIN SODIUM 5 MG PO TABS: ORAL_TABLET | ORAL | 1 refills | Status: DC

## 2018-02-25 NOTE — Telephone Encounter (Signed)
New Message            [facility] needs a refill request for "Warfin"Sodium 5 mg [phone] Fax # [phone]. Spoke with Dahlia Client.

## 2018-02-25 NOTE — Telephone Encounter (Signed)
Refill sent for warfarin while we await response on patient assistance for Xarelto - it appears he has been approved to change if he can get through patient assistance. He does have INR check scheduled for 03/04/18.

## 2018-02-28 ENCOUNTER — Other Ambulatory Visit: Payer: Self-pay | Admitting: Cardiovascular Disease

## 2018-02-28 MED ORDER — WARFARIN SODIUM 5 MG PO TABS: ORAL_TABLET | ORAL | 2 refills | Status: DC

## 2018-02-28 NOTE — Telephone Encounter (Signed)
°*  STAT* If patient is at the pharmacy, call can be transferred to refill team.   1. Which medications need to be refilled? (please list name of each medication and dose if known) Coumadin 5 mg  2. Which pharmacy/location (including street and city if local pharmacy) is medication to be sent to? [facility], [employer].  3. Do they need a 30 day or 90 day supply? 30

## 2018-02-28 NOTE — Telephone Encounter (Signed)
The pt returned his Laural Benes and Laural Benes pt asst application to the office. I have faxed it to [facility] and [place] pt asst foundation.

## 2018-03-04 ENCOUNTER — Ambulatory Visit (INDEPENDENT_AMBULATORY_CARE_PROVIDER_SITE_OTHER): Payer: Medicare Other | Admitting: *Deleted

## 2018-03-04 DIAGNOSIS — I4891 Unspecified atrial fibrillation: Secondary | ICD-10-CM | POA: Diagnosis not present

## 2018-03-04 LAB — POCT INR: INR: 2.6 (ref 2.0–3.0)

## 2018-03-04 NOTE — Patient Instructions (Signed)
Description   Continue taking 1/2 tablet daily except 1 tablet on Mondays, Wednesdays, and Fridays. Continue eating your normal amount of greens of 3 servings each week. Recheck in 4 weeks.       

## 2018-03-31 IMAGING — DX DG ANKLE COMPLETE 3+V*R*
3 series · 3 of 3 positions shown · non-contrast
Comparison: None.

CLINICAL DATA: Pain after trauma

EXAM:
RIGHT ANKLE - COMPLETE 3+ VIEW

[x ankle lat right]
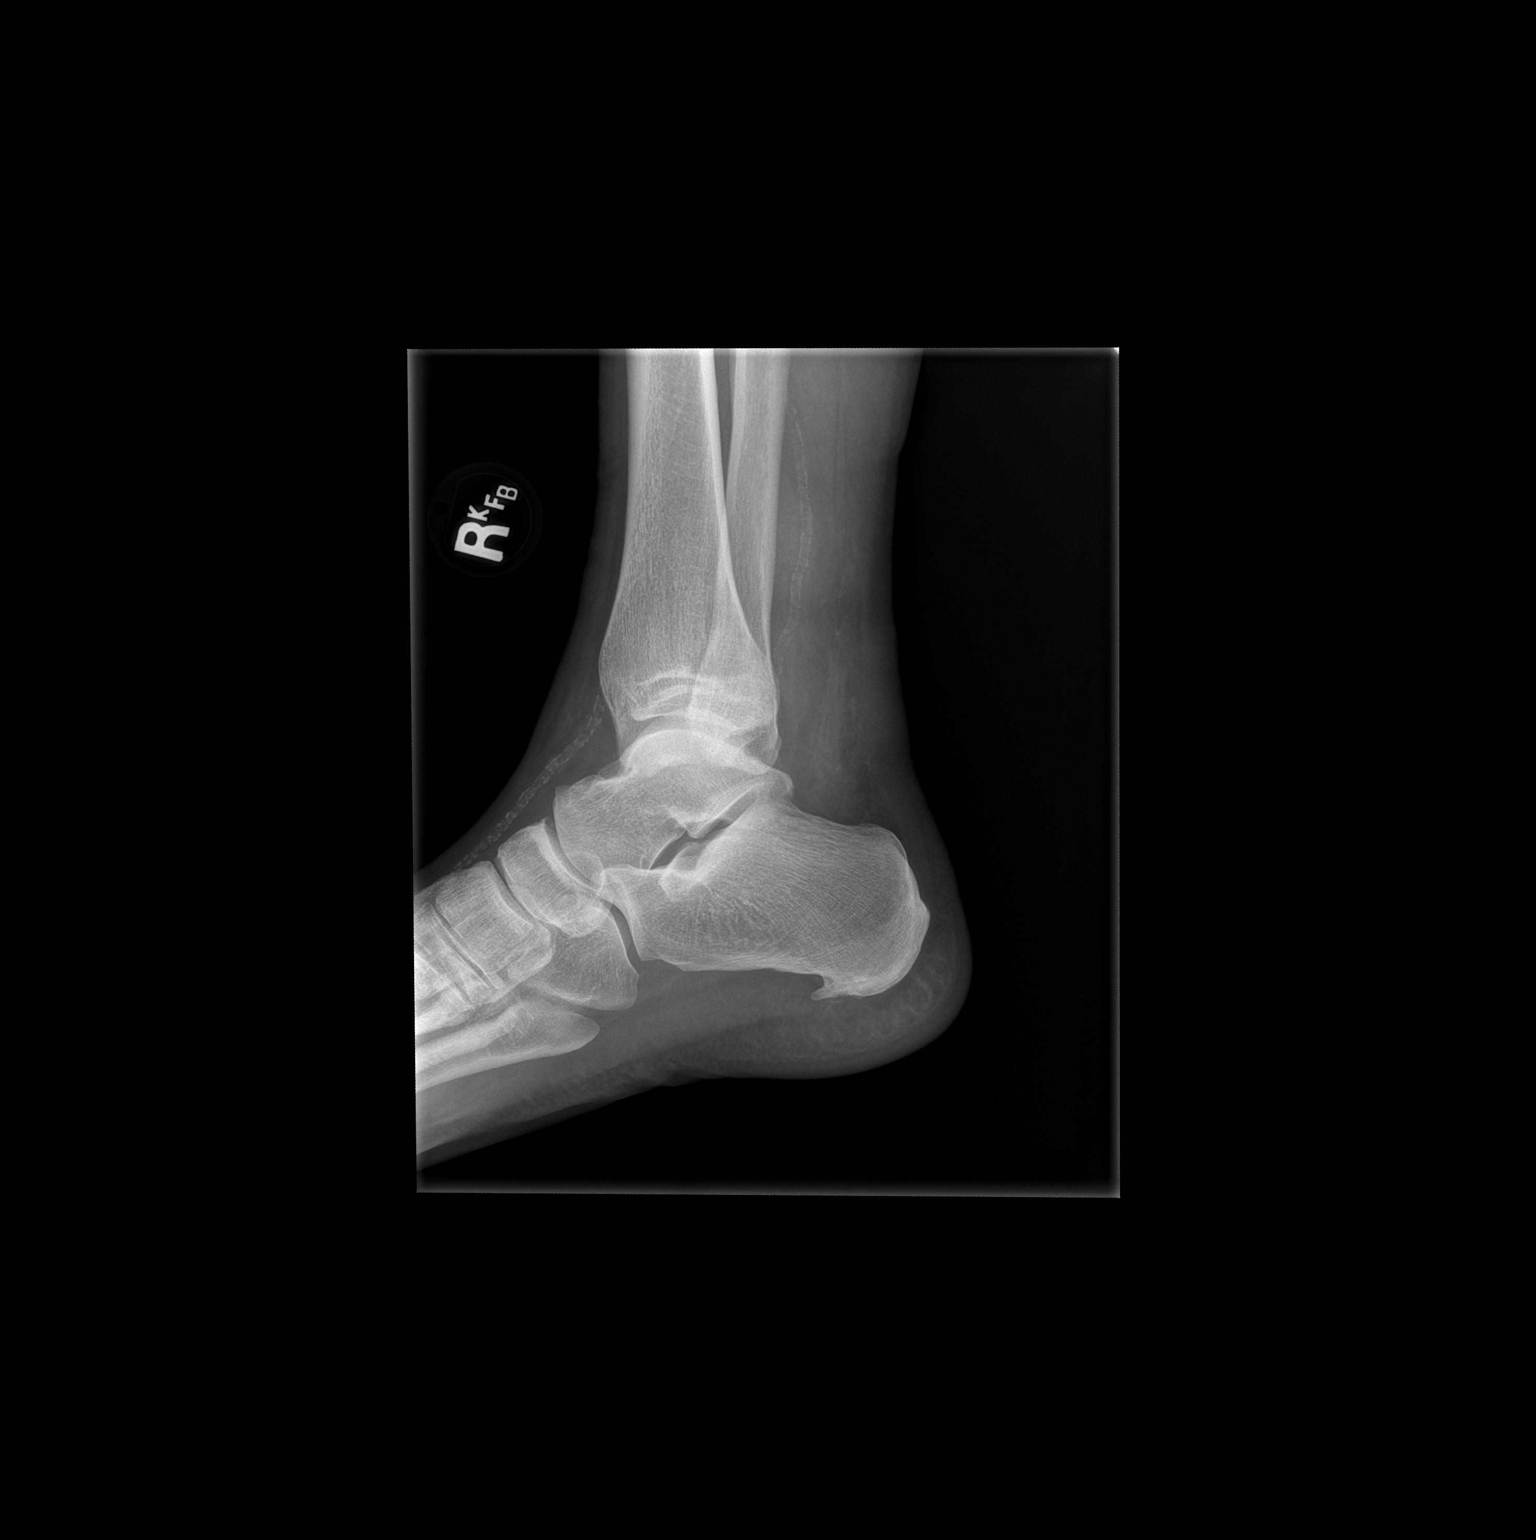

[x ankle ap right]
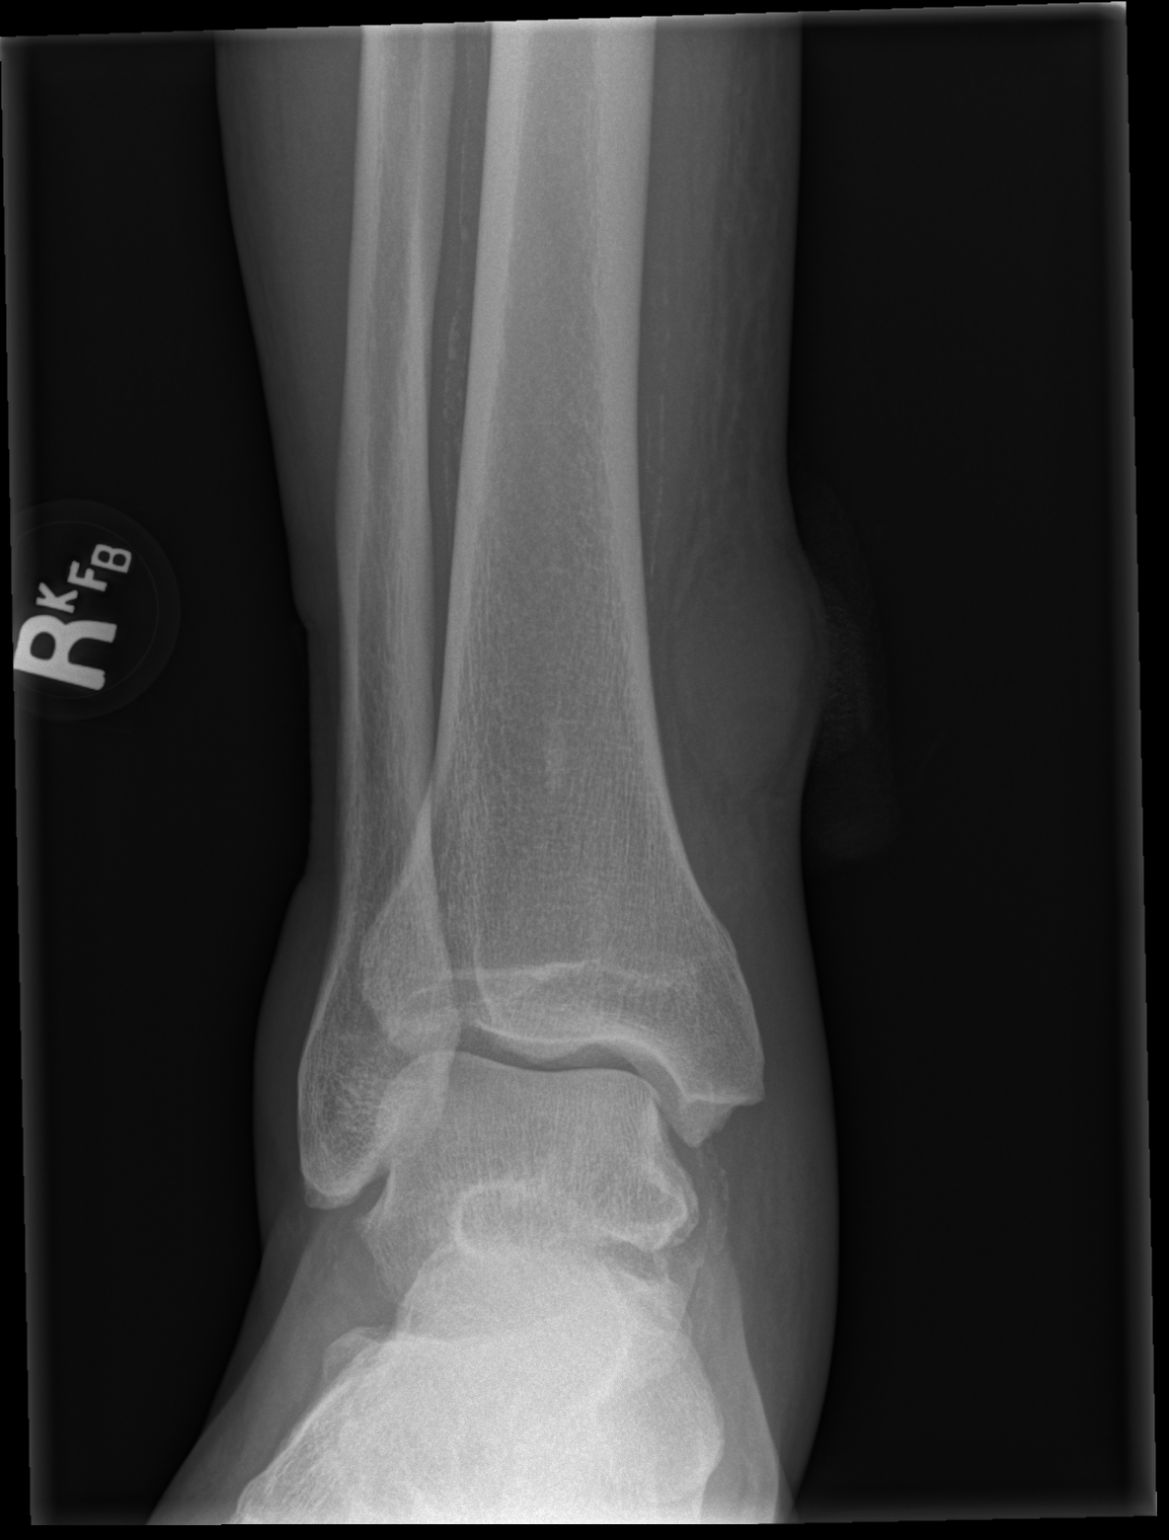

[x ankle obl right]
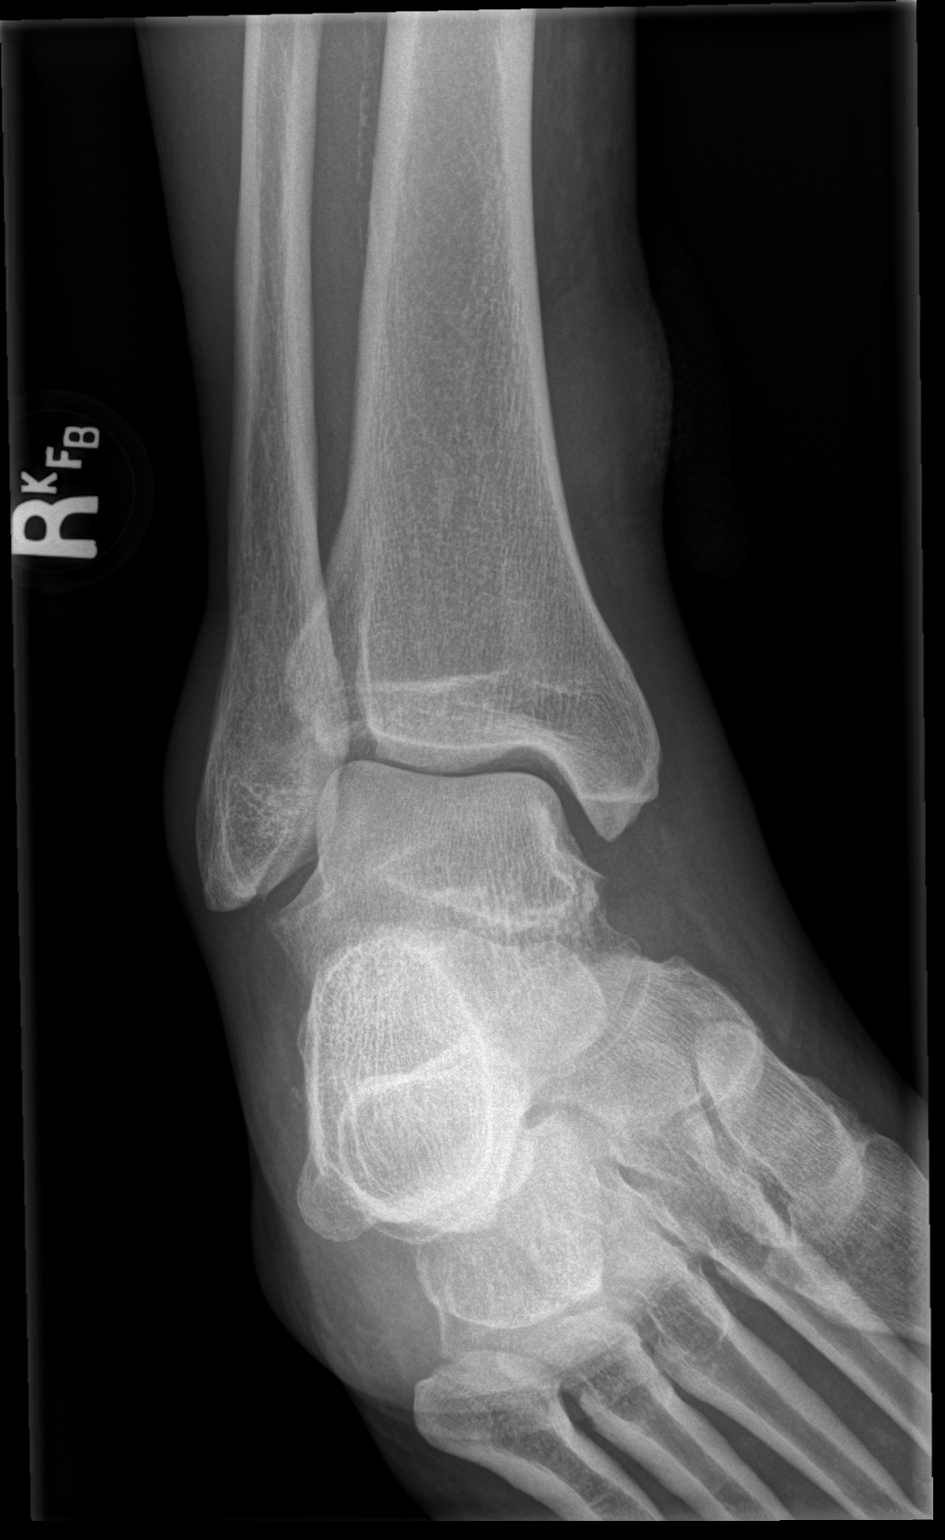

[3 of 3 positions shown; findings below may reference images not displayed]

FINDINGS: Vascular calcifications. Soft tissue swelling over the distal tibia.
No fractures.
IMPRESSION: Soft tissue swelling and probable hematoma at the site of patient's
symptoms. No fractures.

## 2018-04-01 ENCOUNTER — Ambulatory Visit (INDEPENDENT_AMBULATORY_CARE_PROVIDER_SITE_OTHER): Payer: Medicare Other | Admitting: *Deleted

## 2018-04-01 DIAGNOSIS — I4891 Unspecified atrial fibrillation: Secondary | ICD-10-CM | POA: Diagnosis not present

## 2018-04-01 LAB — POCT INR: INR: 2.9 (ref 2.0–3.0)

## 2018-04-01 NOTE — Patient Instructions (Signed)
Description   Continue taking 1/2 tablet daily except 1 tablet on Mondays, Wednesdays, and Fridays. Continue eating your normal amount of greens of 3 servings each week. Recheck in 4 weeks.

## 2018-04-04 ENCOUNTER — Telehealth: Payer: Self-pay

## 2018-04-04 DIAGNOSIS — I4891 Unspecified atrial fibrillation: Secondary | ICD-10-CM

## 2018-04-04 NOTE — Telephone Encounter (Signed)
-----   Message from Raul Deliffany J Muse, RN sent at 04/01/2018 11:49 AM EST ----- Regarding: Pt assistant form for Xarelto  Pt seen today in [facility], wanting follow on paperwork for Xarelto, can you please follow up with patient. Thank you.

## 2018-04-04 NOTE — Telephone Encounter (Signed)
**Note De-Identified Obe Ahlers Obfuscation** I left a detailed message on the pts VM stating that he needs to contact Laural BenesJohnson and [employer] at [phone] to inquire about the process of his pt asst application for his Xarelto.  I also left this office's phone number on the message so he can call back with any questions.

## 2018-04-29 ENCOUNTER — Ambulatory Visit (INDEPENDENT_AMBULATORY_CARE_PROVIDER_SITE_OTHER): Payer: Medicare Other | Admitting: *Deleted

## 2018-04-29 DIAGNOSIS — I4891 Unspecified atrial fibrillation: Secondary | ICD-10-CM

## 2018-04-29 DIAGNOSIS — Z5181 Encounter for therapeutic drug level monitoring: Secondary | ICD-10-CM

## 2018-04-29 LAB — POCT INR: INR: 1.5 — AB (ref 2.0–3.0)

## 2018-04-29 NOTE — Patient Instructions (Addendum)
   Description   Today take 1.5 tablets, tomorrow take 1 tablet then Continue taking 1/2 tablet daily except 1 tablet on Mondays, Wednesdays, and Fridays. Continue eating your normal amount of greens of 3 servings each week. Recheck in 4 weeks.

## 2018-05-04 NOTE — Telephone Encounter (Signed)
**Note De-Identified Lovina Zuver Obfuscation** Letter received from [facility] and [place] stating that they have approved the pt for pt asst with Xarelto. Approval good until 05/03/2019  Will forward this message to Dr Clifton James and his nurse as Carlena Hurl is no longer on the pts med list.

## 2018-05-06 ENCOUNTER — Other Ambulatory Visit: Payer: Self-pay | Admitting: Cardiovascular Disease

## 2018-05-06 NOTE — Addendum Note (Signed)
Addended by: Dossie Arbour on: 05/06/2018 11:42 AM   Modules accepted: Orders

## 2018-05-06 NOTE — Telephone Encounter (Signed)
I spoke with Laural Benes and [place] assistance [phone]).  Pt will receive assistance card in the mail in the next 7-10 business days.  He should take this and prescription to the pharmacy.  I reviewed with coumadin clinic and pt should keep scheduled appointment on 1/31.  He will be given instructions at that time how to change from coumadin to Xarelto.  Will also need BMP and CBC done that day.  He will also be scheduled for one month follow up at this appointment.  I spoke with pt and gave him this information.  He will bring assistance card with him to coumadin clinic appointment.  He is aware prescription will be sent to his pharmacy at that time.  Pt has number to contact assistance program if he does not receive card.

## 2018-05-19 ENCOUNTER — Other Ambulatory Visit: Payer: Self-pay | Admitting: Cardiovascular Disease

## 2018-05-20 ENCOUNTER — Encounter: Payer: Self-pay | Admitting: Cardiovascular Disease

## 2018-05-20 ENCOUNTER — Other Ambulatory Visit: Payer: Self-pay | Admitting: Cardiovascular Disease

## 2018-05-20 MED ORDER — SPIRONOLACTONE 25 MG PO TABS: 25.0000 mg | ORAL_TABLET | Freq: Every day | ORAL | 0 refills | Status: DC

## 2018-05-27 ENCOUNTER — Other Ambulatory Visit: Payer: Medicare Other | Admitting: *Deleted

## 2018-05-27 ENCOUNTER — Ambulatory Visit (INDEPENDENT_AMBULATORY_CARE_PROVIDER_SITE_OTHER): Payer: Medicare Other

## 2018-05-27 DIAGNOSIS — I4891 Unspecified atrial fibrillation: Secondary | ICD-10-CM | POA: Diagnosis not present

## 2018-05-27 DIAGNOSIS — Z5181 Encounter for therapeutic drug level monitoring: Secondary | ICD-10-CM | POA: Diagnosis not present

## 2018-05-27 LAB — BASIC METABOLIC PANEL
BUN/Creatinine Ratio: 14 (ref 10–24)
BUN: 20 mg/dL (ref 8–27)
CO2: 22 mmol/L (ref 20–29)
Calcium: 8.8 mg/dL (ref 8.6–10.2)
Chloride: 102 mmol/L (ref 96–106)
Creatinine, Ser: 1.41 mg/dL — ABNORMAL HIGH (ref 0.76–1.27)
GFR calc non Af Amer: 52 mL/min/{1.73_m2} — ABNORMAL LOW (ref >59)
GFR, EST AFRICAN AMERICAN: 60 mL/min/{1.73_m2} (ref >59)
Glucose: 82 mg/dL (ref 65–99)
Potassium: 4.4 mmol/L (ref 3.5–5.2)
Sodium: 138 mmol/L (ref 134–144)

## 2018-05-27 LAB — CBC
Hematocrit: 43.4 % (ref 37.5–51.0)
Hemoglobin: 14.9 g/dL (ref 13.0–17.7)
MCH: 32 pg (ref 26.6–33.0)
MCHC: 34.3 g/dL (ref 31.5–35.7)
MCV: 93 fL (ref 79–97)
Platelets: 257 10*3/uL (ref 150–450)
RBC: 4.65 x10E6/uL (ref 4.14–5.80)
RDW: 13.3 % (ref 11.6–15.4)
WBC: 7.9 10*3/uL (ref 3.4–10.8)

## 2018-05-27 LAB — POCT INR: INR: 3.9 — AB (ref 2.0–3.0)

## 2018-05-27 MED ORDER — RIVAROXABAN 20 MG PO TABS: 20.0000 mg | ORAL_TABLET | Freq: Every day | ORAL | 5 refills | Status: DC

## 2018-05-27 NOTE — Patient Instructions (Signed)
Description   Discontinue Warfarin today.  Start taking Xarelto once daily on Sunday 05/29/18. Recheck labwork in 1 month.

## 2018-05-27 NOTE — Progress Notes (Signed)
Pt last saw Dr Clifton James 06/07/17, has follow-up appt scheduled for 06/09/18.  Last labs 02/14/17 Creat 1.17 at that time.  Pt had labwork draw today, awaiting labwork results.  Age 68, weight 92kg, CrCl 81.91 based on old labwork. Awaiting results from today's labwork to calculate current CrCl and decide appropriate dosage for pt. Creatinine today 05/27/18 is 1.41, CrCl is 67.97, so will send in rx for Xarelto 20mg  QD to [facility] at pt's request. Pt aware rx sent to pharmacy will stop Warfarin today and start Xarelto on Sunday in the evening with supper.

## 2018-06-09 ENCOUNTER — Encounter: Payer: Self-pay | Admitting: Cardiovascular Disease

## 2018-06-09 ENCOUNTER — Ambulatory Visit (INDEPENDENT_AMBULATORY_CARE_PROVIDER_SITE_OTHER): Payer: Medicare Other | Admitting: Cardiovascular Disease

## 2018-06-09 VITALS — BP 158/108 | HR 92 | Ht 72.0 in | Wt 205.0 lb

## 2018-06-09 DIAGNOSIS — I255 Ischemic cardiomyopathy: Secondary | ICD-10-CM | POA: Diagnosis not present

## 2018-06-09 DIAGNOSIS — I4819 Other persistent atrial fibrillation: Secondary | ICD-10-CM

## 2018-06-09 DIAGNOSIS — E7800 Pure hypercholesterolemia, unspecified: Secondary | ICD-10-CM | POA: Diagnosis not present

## 2018-06-09 DIAGNOSIS — I2510 Atherosclerotic heart disease of native coronary artery without angina pectoris: Secondary | ICD-10-CM

## 2018-06-09 DIAGNOSIS — I10 Essential (primary) hypertension: Secondary | ICD-10-CM

## 2018-06-09 NOTE — Patient Instructions (Signed)
Medication Instructions:  Your physician recommends that you continue on your current medications as directed. Please refer to the Current Medication list given to you today.  If you need a refill on your cardiac medications before your next appointment, please call your pharmacy.   Lab work: Your physician recommends that you return for lab work on March 5,2020--CBC, CMET and Lipid profile.  This will be fasting.  The lab opens at 7:30  If you have labs (blood work) drawn today and your tests are completely normal, you will receive your results only by: Marland Kitchen MyChart Message (if you have MyChart) OR . A paper copy in the mail If you have any lab test that is abnormal or we need to change your treatment, we will call you to review the results.  Testing/Procedures: none  Follow-Up: At [facility], you and your health needs are our priority.  As part of our continuing mission to provide you with exceptional heart care, we have created designated Provider Care Teams.  These Care Teams include your primary Cardiologist (physician) and Advanced Practice Providers (APPs -  Physician Assistants and Nurse Practitioners) who all work together to provide you with the care you need, when you need it. You will need a follow up appointment in 12 months.  Please call our office 4 months in advance to schedule this appointment.  You may see Verne Carrow, MD or one of the following Advanced Practice Providers on your designated Care Team:   [place], PA-C Ronie Spies, PA-C . Jacolyn Reedy, PA-C  Any Other Special Instructions Will Be Listed Below (If Applicable).

## 2018-06-09 NOTE — Progress Notes (Signed)
Chief Complaint  Patient presents with  . Follow-up    CAD/Atrial fib   History of Present Illness: 68 yo male with history of CAD, persistent atrial fibrillation and ischemic cardiomyopathy here today for cardiac follow up. He had an inferior STEMI in  November 2012 with cardiomyopathy felt to be out of proportion to his CAD. At that time he reported heavy alcohol and tobacco abuse. He was in atrial fib at the time of admission. Cardiac cath with occluded RCA treated with a drug eluting stent. He was also found to have 50% ostial diagonal stenosis, 70% proximal Circumflex stenosis, 80% mid Circumflex stenosis, 40% stenoses in both obtuse marginal branches. LVEF as low as 15% in 2012. He has staged PCI with placement of drug eluting stents in the proximal and mid Circumflex and distal RCA in January 2013. He stopped drinking alcohol and tobacco. LVEF improved to 55% by echo April 2013. He underwent cardioversion in 2013 but converted back to atrial fib later that year. He is currently on Xarelto. Echo September 2017 with LVEF 40-45%, no significant valve disease.   He is here today for follow up. The patient denies any chest pain, dyspnea, palpitations, lower extremity edema, orthopnea, PND, dizziness, near syncope or syncope.    Primary Care Physician: Patient, No Pcp Per  Past Medical History:  Diagnosis Date  . Alcohol abuse   . Atrial fibrillation (HCC)    On coumadin  . CAD (coronary artery disease)    Inf STEMI 11/12: LHC at [facility] 02/27/11: oDx 50%, pCFX 70%, mCFX 80%, OM1 40%, oOM2 40%, dCFX 99% (small - 2mm), pRCA occluded, EF 35%.  He underwent PCI with Promus DES to the pRCA. s/p planned PCI w/ DES to prox Lcx, DES to mid LCx, DES to distal RCA 05/13/11.  Marland Kitchen Chronic lower back pain    "sciatic nerve"  . Chronic systolic heart failure (HCC)   . Hemorrhoids   . HLD (hyperlipidemia)   . HTN (hypertension)   . Ischemic cardiomyopathy    Echo 02/27/11: EF 15%, mod LVE, mild to mod MR, mod  BAE, mod RVE, mod decreased RVSF, PASP 48, mild to mod pulmo HTN.  . Tobacco abuse     Past Surgical History:  Procedure Laterality Date  . BACK SURGERY  ~ 1987   L4-5 ruptured disc  . CARDIOVERSION  07/02/2011   Procedure: CARDIOVERSION;  Surgeon: Kathleene Hazel, MD;  Location: [facility] OR;  Service: Cardiovascular;  Laterality: N/A;  . CORONARY ANGIOPLASTY WITH STENT PLACEMENT  02/27/11   "1"  . CORONARY ANGIOPLASTY WITH STENT PLACEMENT  05/13/11   "3"  . PERCUTANEOUS CORONARY STENT INTERVENTION (PCI-S) N/A 05/13/2011   Procedure: PERCUTANEOUS CORONARY STENT INTERVENTION (PCI-S);  Surgeon: Kathleene Hazel, MD;  Location: [facility] CATH LAB;  Service: Cardiovascular;  Laterality: N/A;    Current Outpatient Medications  Medication Sig Dispense Refill  . acetaminophen (TYLENOL) 500 MG tablet Take 500 mg by mouth every 6 (six) hours as needed for pain.    Marland Kitchen aspirin EC 81 MG tablet Take 1 tablet (81 mg total) by mouth daily. 30 tablet 3  . lisinopril (PRINIVIL,ZESTRIL) 2.5 MG tablet Take 1 tablet (2.5 mg total) by mouth daily. Please keep upcoming appt for future refills. Thank you. 30 tablet 1  . metoprolol tartrate (LOPRESSOR) 50 MG tablet Take 1 tablet (50 mg total) by mouth 2 (two) times daily. 180 tablet 3  . NITROSTAT 0.4 MG SL tablet PLACE 1 TABLET UNDER TONGUE EVERY  5 MINUTES AS NEEDED FOR CHEST PAIN.MAXIMUM OF 3 TABLETS! 25 tablet 3  . rivaroxaban (XARELTO) 20 MG TABS tablet Take 1 tablet (20 mg total) by mouth daily with supper. 30 tablet 5  . rosuvastatin (CRESTOR) 10 MG tablet TAKE ONE TABLET BY MOUTH EVERY DAY 30 tablet 11  . sildenafil (VIAGRA) 50 MG tablet Take 1 tablet (50 mg total) by mouth daily as needed for erectile dysfunction. 5 tablet 6  . spironolactone (ALDACTONE) 25 MG tablet Take 1 tablet (25 mg total) by mouth daily. Please keep upcoming appt for future refills. Thank you. 90 tablet 0   No current facility-administered medications for this visit.     Allergies    Allergen Reactions  . Lipitor [Atorvastatin] Other (See Comments)    Numbness, aching, unable to sleep    Social History   Socioeconomic History  . Marital status: Single    Spouse name: Not on file  . Number of children: Not on file  . Years of education: Not on file  . Highest education level: Not on file  Occupational History  . Not on file  Social Needs  . Financial resource strain: Not on file  . Food insecurity:    Worry: Not on file    Inability: Not on file  . Transportation needs:    Medical: Not on file    Non-medical: Not on file  Tobacco Use  . Smoking status: Former Smoker    Packs/day: 1.00    Years: 44.00    Pack years: 44.00    Types: Cigarettes    Last attempt to quit: 02/27/2011    Years since quitting: 7.2  . Smokeless tobacco: Former NeurosurgeonUser    Types: Chew    Quit date: 02/27/2011  Substance and Sexual Activity  . Alcohol use: Yes    Comment: "stopped drinking alcohol 02/27/11; had been drinking 1 mixed drink qd 365 days/yr""  . Drug use: Yes    Types: Marijuana    Comment: "used pot ages [age]"  . Sexual activity: Yes  Lifestyle  . Physical activity:    Days per week: Not on file    Minutes per session: Not on file  . Stress: Not on file  Relationships  . Social connections:    Talks on phone: Not on file    Gets together: Not on file    Attends religious service: Not on file    Active member of club or organization: Not on file    Attends meetings of clubs or organizations: Not on file    Relationship status: Not on file  . Intimate partner violence:    Fear of current or ex partner: Not on file    Emotionally abused: Not on file    Physically abused: Not on file    Forced sexual activity: Not on file  Other Topics Concern  . Not on file  Social History Narrative  . Not on file    Family History  Problem Relation Age of Onset  . Hypertension Mother   . Heart attack Mother [id number]  . Stroke Father        5 STROKES AND MI IN HIS 80'S   . Heart attack Father 680    Review of Systems:  As stated in the HPI and otherwise negative.   BP (!) 158/108   Pulse 92   Ht 6' (1.829 m)   Wt 205 lb (93 kg)   SpO2 96%   BMI 27.80 kg/m  Physical Examination:  General: Well developed, well nourished, NAD  HEENT: OP clear, mucus membranes moist  SKIN: warm, dry. No rashes. Neuro: No focal deficits  Musculoskeletal: Muscle strength 5/5 all ext  Psychiatric: Mood and affect normal  Neck: No JVD, no carotid bruits, no thyromegaly, no lymphadenopathy.  Lungs:Clear bilaterally, no wheezes, rhonci, crackles Cardiovascular: Irregular, irregular.  No murmurs, gallops or rubs. Abdomen:Soft. Bowel sounds present. Non-tender.  Extremities: No lower extremity edema. Pulses are 2 + in the bilateral DP/PT.  Echo 01/20/16: Left ventricle: Wall thickness was increased in a pattern of mild   LVH. Systolic function was mildly to moderately reduced. The   estimated ejection fraction was in the range of 40% to 45%. Mild   diffuse hypokinesis with distinct regional wall motion   abnormalities. Hypokinesis of the basal-midinferior myocardium;   consistent with ischemia in the distribution of the right   coronary artery. - Aortic valve: There was trivial regurgitation. - Left atrium: The atrium was moderately dilated. - Right atrium: The atrium was moderately dilated.  EKG:  EKG is ordered today. The ekg ordered today demonstrates  Atrial fib, rate 92 bpm  Recent Labs: 05/27/2018: BUN 20; Creatinine, Ser 1.41; Hemoglobin 14.9; Platelets 257; Potassium 4.4; Sodium 138   Lipid Panel    Component Value Date/Time   CHOL 164 06/07/2017 0847   TRIG 305 (H) 06/07/2017 0847   HDL 31 (L) 06/07/2017 0847   CHOLHDL 5.3 (H) 06/07/2017 0847   CHOLHDL 5.2 (H) 04/01/2016 0816   VLDL 40 (H) 04/01/2016 0816   LDLCALC 72 06/07/2017 0847     Wt Readings from Last 3 Encounters:  06/09/18 205 lb (93 kg)  06/07/17 202 lb 12.8 oz (92 kg)  02/14/17 198  lb 4 oz (89.9 kg)     Other studies Reviewed: Additional studies/ records that were reviewed today include: . Review of the above records demonstrates:    Assessment and Plan:   1. Atrial fibrillation, persistent: He is in atrial fib today with good heart rate control. Will continue Lopressor and Xarelto. CBC and BMET in several weeks.   2. CAD without angina: No chest pain. Continue ASA, statin and beta blocker.   3. Ischemic cardiomyopathy: He remains NYHA class 1. LVEF 40-45% by echo Sept 2017. Continue beta blocker and Ace-inh.    4. Tobacco abuse, in remission: He no longer smokes. He quit in 2012.   5. HTN: BP is controlled at home   6. Hyperlipidemia: LDL near goal in February [id number]. He is tolerating Crestor at a low dose. He has not tolerated higher doses of statins. Will continue Crestor. Will check lipids and LFTs in several weeks when he comes back for his other labs.   Current medicines are reviewed at length with the patient today.  The patient does not have concerns regarding medicines.  The following changes have been made:  no change  Labs/ tests ordered today include:   Orders Placed This Encounter  Procedures  . Lipid Profile  . Hepatic function panel  . EKG 12-Lead    Disposition:   FU with me in 12 months  Signed, Verne Carrowhristopher Sherlie Boyum, MD 06/09/2018 9:34 AM    [facility] [address], [place], [place]  [address] Phone: [phone]; Fax: [phone]

## 2018-06-30 ENCOUNTER — Other Ambulatory Visit: Payer: Medicare Other | Admitting: *Deleted

## 2018-06-30 DIAGNOSIS — E7800 Pure hypercholesterolemia, unspecified: Secondary | ICD-10-CM

## 2018-06-30 DIAGNOSIS — I2510 Atherosclerotic heart disease of native coronary artery without angina pectoris: Secondary | ICD-10-CM

## 2018-06-30 DIAGNOSIS — Z5181 Encounter for therapeutic drug level monitoring: Secondary | ICD-10-CM

## 2018-06-30 DIAGNOSIS — I4891 Unspecified atrial fibrillation: Secondary | ICD-10-CM

## 2018-06-30 LAB — CBC
Hematocrit: 45.8 % (ref 37.5–51.0)
Hemoglobin: 15.3 g/dL (ref 13.0–17.7)
MCH: 32.3 pg (ref 26.6–33.0)
MCHC: 33.4 g/dL (ref 31.5–35.7)
MCV: 97 fL (ref 79–97)
Platelets: 277 10*3/uL (ref 150–450)
RBC: 4.74 x10E6/uL (ref 4.14–5.80)
RDW: 13.1 % (ref 11.6–15.4)
WBC: 8.4 10*3/uL (ref 3.4–10.8)

## 2018-06-30 LAB — BASIC METABOLIC PANEL
BUN / CREAT RATIO: 13 (ref 10–24)
BUN: 17 mg/dL (ref 8–27)
CO2: 23 mmol/L (ref 20–29)
Calcium: 9.2 mg/dL (ref 8.6–10.2)
Chloride: 102 mmol/L (ref 96–106)
Creatinine, Ser: 1.33 mg/dL — ABNORMAL HIGH (ref 0.76–1.27)
GFR calc Af Amer: 64 mL/min/{1.73_m2} (ref >59)
GFR calc non Af Amer: 56 mL/min/{1.73_m2} — ABNORMAL LOW (ref >59)
Glucose: 102 mg/dL — ABNORMAL HIGH (ref 65–99)
Potassium: 5 mmol/L (ref 3.5–5.2)
Sodium: 138 mmol/L (ref 134–144)

## 2018-06-30 LAB — LIPID PANEL
CHOL/HDL RATIO: 3.4 ratio (ref 0.0–5.0)
Cholesterol, Total: 133 mg/dL (ref 100–199)
HDL: 39 mg/dL — ABNORMAL LOW (ref >39)
LDL Calculated: 68 mg/dL (ref 0–99)
Triglycerides: 130 mg/dL (ref 0–149)
VLDL Cholesterol Cal: 26 mg/dL (ref 5–40)

## 2018-06-30 LAB — HEPATIC FUNCTION PANEL
ALBUMIN: 4 g/dL (ref 3.8–4.8)
ALT: 14 IU/L (ref 0–44)
AST: 16 IU/L (ref 0–40)
Alkaline Phosphatase: 70 IU/L (ref 39–117)
Bilirubin Total: 0.6 mg/dL (ref 0.0–1.2)
Bilirubin, Direct: 0.18 mg/dL (ref 0.00–0.40)
Total Protein: 6.6 g/dL (ref 6.0–8.5)

## 2018-07-04 ENCOUNTER — Other Ambulatory Visit: Payer: Self-pay | Admitting: Cardiovascular Disease

## 2018-07-05 ENCOUNTER — Telehealth: Payer: Self-pay | Admitting: Cardiovascular Disease

## 2018-07-05 NOTE — Telephone Encounter (Signed)
Reviewed lab results with patient who verbalized understanding. He was grateful for the information.

## 2018-07-05 NOTE — Telephone Encounter (Signed)
New message     Patient calling to request call with lab results

## 2018-09-13 ENCOUNTER — Other Ambulatory Visit: Payer: Self-pay | Admitting: Cardiovascular Disease

## 2018-09-27 ENCOUNTER — Other Ambulatory Visit: Payer: Self-pay | Admitting: Cardiovascular Disease

## 2018-10-03 ENCOUNTER — Telehealth: Payer: Self-pay | Admitting: Cardiovascular Disease

## 2018-10-03 ENCOUNTER — Other Ambulatory Visit: Payer: Self-pay | Admitting: *Deleted

## 2018-10-03 MED ORDER — NITROGLYCERIN 0.4 MG SL SUBL: SUBLINGUAL_TABLET | SUBLINGUAL | 3 refills | Status: AC

## 2018-10-03 NOTE — Telephone Encounter (Signed)
New Message    *STAT* If patient is at the pharmacy, call can be transferred to refill team.   1. Which medications need to be refilled? (please list name of each medication and dose if known) NITROSTAT 0.4 MG SL tablet   2. Which pharmacy/location (including street and city if local pharmacy) is medication to be sent to? [place], [place]  3. Do they need a 30 day or 90 day supply? [place]

## 2018-11-01 ENCOUNTER — Other Ambulatory Visit: Payer: Self-pay | Admitting: Cardiovascular Disease

## 2018-11-01 NOTE — Telephone Encounter (Signed)
Prescription refill request for Xarelto received.   Last office visit: Dr. Julianne Handler (06-09-2018) Weight: 93 kg (06-09-2018) Age: 68 y.o. Scr: 1.33 (06-30-2018) CrCl:

## 2018-11-01 NOTE — Telephone Encounter (Signed)
Prescription refill request for Xarelto received.   Last office visit: Dr. Julianne Handler (06-09-2018) Weight: 93 kg (06-09-2018) Age: 68 y.o. Scr: 1.33 (06-30-2018) CrCl:72

## 2019-01-16 ENCOUNTER — Telehealth: Payer: Self-pay | Admitting: Cardiovascular Disease

## 2019-01-16 NOTE — Telephone Encounter (Signed)
Left message to call office

## 2019-01-16 NOTE — Telephone Encounter (Signed)
I spoke with pt who reports on 4 occasions over the last several weeks he has felt lightheaded for about 15 minutes. Had one episode when he felt nauseated and sweaty when lightheaded.   Improved when he sat down.  Dizziness is worse when turning his head quickly. States he "blew his eardrum out" about 4 years ago and it felt like this.  Has been having issues with allergies and sneezing for last 1-2 months.  No fever. No chest pain. No shortness of breath. BP normal for pt.   Pt does not have a primary care doctor but does go to urgent care when needed.  I asked him to contact urgent care to discuss his symptoms.

## 2019-01-16 NOTE — Telephone Encounter (Signed)
Follow up ° ° °Patient is returning your call. Please call. ° ° ° °

## 2019-01-16 NOTE — Telephone Encounter (Signed)
°  Patient calling to report dizziness for at least 1 month off and on  1) Are you dizzy now? NO  2) Do you feel faint or have you passed out? NO  3) Do you have any other symptoms? sweating  4) Have you checked your HR and BP (record if available)? 126/83

## 2019-02-26 DEATH — deceased

## 2019-02-27 ENCOUNTER — Other Ambulatory Visit: Payer: Self-pay | Admitting: Cardiovascular Disease

## 2019-02-27 ENCOUNTER — Telehealth: Payer: Self-pay | Admitting: Cardiovascular Disease

## 2019-02-27 NOTE — Telephone Encounter (Signed)
Signed death certificate placed in medical records attn [place].

## 2019-02-27 NOTE — Telephone Encounter (Signed)
Death certificate for cremation received via fax from [place] for Dr. Angelena Form.

## 2019-02-28 ENCOUNTER — Telehealth: Payer: Self-pay | Admitting: Cardiovascular Disease

## 2019-02-28 NOTE — Telephone Encounter (Signed)
Signed death certificate faxed to [facility] as requested by funeral home. 02/28/19 vlm

## 2019-03-01 ENCOUNTER — Telehealth: Payer: Self-pay | Admitting: Cardiovascular Disease

## 2019-03-01 NOTE — Telephone Encounter (Signed)
Original death certificate received from [place]. Placed in box for Dr. Angelena Form to sign. 03/01/19 vlm

## 2019-03-02 NOTE — Telephone Encounter (Signed)
Death certificate signed by Dr. Angelena Form returned to medical records.
